# Patient Record
Sex: Male | Born: 1965
Health system: Southern US, Community
[De-identification: ages and names within clinical notes are randomized; demographics above are authoritative.]

## PROBLEM LIST (undated history)

## (undated) DIAGNOSIS — I10 Essential (primary) hypertension: Secondary | ICD-10-CM

## (undated) DIAGNOSIS — I219 Acute myocardial infarction, unspecified: Secondary | ICD-10-CM

## (undated) DIAGNOSIS — F319 Bipolar disorder, unspecified: Secondary | ICD-10-CM

## (undated) DIAGNOSIS — R079 Chest pain, unspecified: Secondary | ICD-10-CM

## (undated) HISTORY — DX: Chest pain, unspecified: R07.9

---

## 1997-09-19 ENCOUNTER — Encounter: Admission: RE | Admit: 1997-09-19 | Discharge: 1997-09-19 | Payer: Self-pay | Admitting: Family Medicine

## 1998-01-17 ENCOUNTER — Emergency Department (HOSPITAL_COMMUNITY): Admission: EM | Admit: 1998-01-17 | Discharge: 1998-01-17 | Payer: Self-pay | Admitting: Emergency Medicine

## 1998-01-20 ENCOUNTER — Encounter: Admission: RE | Admit: 1998-01-20 | Discharge: 1998-01-20 | Payer: Self-pay | Admitting: Family Medicine

## 1999-08-22 ENCOUNTER — Emergency Department (HOSPITAL_COMMUNITY): Admission: EM | Admit: 1999-08-22 | Discharge: 1999-08-22 | Payer: Self-pay | Admitting: *Deleted

## 2000-09-05 ENCOUNTER — Emergency Department (HOSPITAL_COMMUNITY): Admission: EM | Admit: 2000-09-05 | Discharge: 2000-09-05 | Payer: Self-pay

## 2000-09-10 ENCOUNTER — Encounter: Payer: Self-pay | Admitting: Orthopedic Surgery

## 2000-09-10 ENCOUNTER — Ambulatory Visit (HOSPITAL_COMMUNITY): Admission: RE | Admit: 2000-09-10 | Discharge: 2000-09-10 | Payer: Self-pay | Admitting: Orthopedic Surgery

## 2001-02-11 ENCOUNTER — Emergency Department (HOSPITAL_COMMUNITY): Admission: EM | Admit: 2001-02-11 | Discharge: 2001-02-11 | Payer: Self-pay

## 2001-03-30 ENCOUNTER — Encounter: Admission: RE | Admit: 2001-03-30 | Discharge: 2001-03-30 | Payer: Self-pay | Admitting: Family Medicine

## 2001-04-01 ENCOUNTER — Emergency Department (HOSPITAL_COMMUNITY): Admission: EM | Admit: 2001-04-01 | Discharge: 2001-04-01 | Payer: Self-pay

## 2001-04-06 ENCOUNTER — Ambulatory Visit (HOSPITAL_COMMUNITY): Admission: RE | Admit: 2001-04-06 | Discharge: 2001-04-06 | Payer: Self-pay | Admitting: Gastroenterology

## 2001-05-19 ENCOUNTER — Emergency Department (HOSPITAL_COMMUNITY): Admission: EM | Admit: 2001-05-19 | Discharge: 2001-05-19 | Payer: Self-pay | Admitting: Emergency Medicine

## 2001-05-19 ENCOUNTER — Encounter: Payer: Self-pay | Admitting: Emergency Medicine

## 2001-07-23 ENCOUNTER — Emergency Department (HOSPITAL_COMMUNITY): Admission: EM | Admit: 2001-07-23 | Discharge: 2001-07-23 | Payer: Self-pay | Admitting: Emergency Medicine

## 2002-06-09 ENCOUNTER — Encounter: Admission: RE | Admit: 2002-06-09 | Discharge: 2002-06-09 | Payer: Self-pay | Admitting: Sports Medicine

## 2002-08-30 ENCOUNTER — Emergency Department (HOSPITAL_COMMUNITY): Admission: EM | Admit: 2002-08-30 | Discharge: 2002-08-30 | Payer: Self-pay | Admitting: *Deleted

## 2002-08-30 ENCOUNTER — Encounter: Payer: Self-pay | Admitting: *Deleted

## 2002-12-31 ENCOUNTER — Emergency Department (HOSPITAL_COMMUNITY): Admission: EM | Admit: 2002-12-31 | Discharge: 2002-12-31 | Payer: Self-pay

## 2002-12-31 ENCOUNTER — Encounter: Payer: Self-pay | Admitting: *Deleted

## 2003-12-29 ENCOUNTER — Emergency Department (HOSPITAL_COMMUNITY): Admission: EM | Admit: 2003-12-29 | Discharge: 2003-12-29 | Payer: Self-pay | Admitting: Family Medicine

## 2004-05-21 ENCOUNTER — Other Ambulatory Visit: Payer: Self-pay

## 2004-05-21 ENCOUNTER — Emergency Department: Payer: Self-pay | Admitting: Emergency Medicine

## 2004-07-25 ENCOUNTER — Ambulatory Visit: Payer: Self-pay | Admitting: Family Medicine

## 2004-07-26 ENCOUNTER — Encounter: Admission: RE | Admit: 2004-07-26 | Discharge: 2004-07-26 | Payer: Self-pay | Admitting: Sports Medicine

## 2004-09-07 ENCOUNTER — Ambulatory Visit: Payer: Self-pay | Admitting: Sports Medicine

## 2005-01-29 ENCOUNTER — Emergency Department (HOSPITAL_COMMUNITY): Admission: EM | Admit: 2005-01-29 | Discharge: 2005-01-29 | Payer: Self-pay | Admitting: Emergency Medicine

## 2005-07-18 ENCOUNTER — Ambulatory Visit: Payer: Self-pay | Admitting: Internal Medicine

## 2005-07-23 ENCOUNTER — Ambulatory Visit: Payer: Self-pay | Admitting: Internal Medicine

## 2005-11-20 ENCOUNTER — Emergency Department (HOSPITAL_COMMUNITY): Admission: EM | Admit: 2005-11-20 | Discharge: 2005-11-20 | Payer: Self-pay | Admitting: *Deleted

## 2006-08-07 DIAGNOSIS — K920 Hematemesis: Secondary | ICD-10-CM | POA: Insufficient documentation

## 2006-08-07 DIAGNOSIS — R112 Nausea with vomiting, unspecified: Secondary | ICD-10-CM | POA: Insufficient documentation

## 2006-11-19 ENCOUNTER — Emergency Department (HOSPITAL_COMMUNITY): Admission: EM | Admit: 2006-11-19 | Discharge: 2006-11-19 | Payer: Self-pay | Admitting: Emergency Medicine

## 2007-02-06 ENCOUNTER — Encounter: Payer: Self-pay | Admitting: Internal Medicine

## 2009-01-01 ENCOUNTER — Emergency Department (HOSPITAL_COMMUNITY): Admission: EM | Admit: 2009-01-01 | Discharge: 2009-01-01 | Payer: Self-pay | Admitting: Emergency Medicine

## 2009-07-29 ENCOUNTER — Emergency Department (HOSPITAL_COMMUNITY): Admission: EM | Admit: 2009-07-29 | Discharge: 2009-07-29 | Payer: Self-pay | Admitting: Emergency Medicine

## 2010-04-23 ENCOUNTER — Emergency Department (HOSPITAL_COMMUNITY): Admission: EM | Admit: 2010-04-23 | Discharge: 2010-04-24 | Payer: Self-pay | Admitting: Emergency Medicine

## 2010-08-21 LAB — DIFFERENTIAL
Basophils Relative: 0 % (ref 0–1)
Eosinophils Absolute: 0 10*3/uL (ref 0.0–0.7)
Eosinophils Relative: 1 % (ref 0–5)
Lymphs Abs: 1.1 10*3/uL (ref 0.7–4.0)
Monocytes Absolute: 0.8 10*3/uL (ref 0.1–1.0)
Monocytes Relative: 11 % (ref 3–12)

## 2010-08-21 LAB — CBC
HCT: 37.6 % — ABNORMAL LOW (ref 39.0–52.0)
Hemoglobin: 13 g/dL (ref 13.0–17.0)
MCH: 30 pg (ref 26.0–34.0)
MCHC: 34.6 g/dL (ref 30.0–36.0)
MCV: 86.6 fL (ref 78.0–100.0)
RBC: 4.34 MIL/uL (ref 4.22–5.81)

## 2010-08-21 LAB — URINALYSIS, ROUTINE W REFLEX MICROSCOPIC
Bilirubin Urine: NEGATIVE
Glucose, UA: NEGATIVE mg/dL
Hgb urine dipstick: NEGATIVE
Protein, ur: NEGATIVE mg/dL
Urobilinogen, UA: 0.2 mg/dL (ref 0.0–1.0)

## 2010-08-21 LAB — POCT I-STAT, CHEM 8
BUN: 11 mg/dL (ref 6–23)
Creatinine, Ser: 0.9 mg/dL (ref 0.4–1.5)
Glucose, Bld: 99 mg/dL (ref 70–99)
Hemoglobin: 13.3 g/dL (ref 13.0–17.0)
Sodium: 140 mEq/L (ref 135–145)
TCO2: 25 mmol/L (ref 0–100)

## 2011-05-07 ENCOUNTER — Emergency Department (HOSPITAL_COMMUNITY): Payer: Managed Care, Other (non HMO)

## 2011-05-07 ENCOUNTER — Emergency Department (HOSPITAL_COMMUNITY)
Admission: EM | Admit: 2011-05-07 | Discharge: 2011-05-07 | Disposition: A | Discharge status: Home / Self Care | Payer: Managed Care, Other (non HMO) | Attending: Emergency Medicine | Admitting: Emergency Medicine

## 2011-05-07 ENCOUNTER — Encounter: Payer: Self-pay | Admitting: *Deleted

## 2011-05-07 DIAGNOSIS — K5289 Other specified noninfective gastroenteritis and colitis: Secondary | ICD-10-CM | POA: Insufficient documentation

## 2011-05-07 DIAGNOSIS — R109 Unspecified abdominal pain: Secondary | ICD-10-CM | POA: Insufficient documentation

## 2011-05-07 DIAGNOSIS — F319 Bipolar disorder, unspecified: Secondary | ICD-10-CM | POA: Insufficient documentation

## 2011-05-07 DIAGNOSIS — R197 Diarrhea, unspecified: Secondary | ICD-10-CM | POA: Insufficient documentation

## 2011-05-07 DIAGNOSIS — K529 Noninfective gastroenteritis and colitis, unspecified: Secondary | ICD-10-CM

## 2011-05-07 DIAGNOSIS — R05 Cough: Secondary | ICD-10-CM | POA: Insufficient documentation

## 2011-05-07 DIAGNOSIS — R059 Cough, unspecified: Secondary | ICD-10-CM | POA: Insufficient documentation

## 2011-05-07 DIAGNOSIS — R112 Nausea with vomiting, unspecified: Secondary | ICD-10-CM | POA: Insufficient documentation

## 2011-05-07 DIAGNOSIS — R509 Fever, unspecified: Secondary | ICD-10-CM | POA: Insufficient documentation

## 2011-05-07 DIAGNOSIS — IMO0001 Reserved for inherently not codable concepts without codable children: Secondary | ICD-10-CM | POA: Insufficient documentation

## 2011-05-07 DIAGNOSIS — Z79899 Other long term (current) drug therapy: Secondary | ICD-10-CM | POA: Insufficient documentation

## 2011-05-07 HISTORY — DX: Bipolar disorder, unspecified: F31.9

## 2011-05-07 LAB — COMPREHENSIVE METABOLIC PANEL
CO2: 26 mEq/L (ref 19–32)
Calcium: 8.8 mg/dL (ref 8.4–10.5)
Creatinine, Ser: 0.7 mg/dL (ref 0.50–1.35)
GFR calc Af Amer: >90 mL/min (ref >90)
GFR calc non Af Amer: >90 mL/min (ref >90)
Glucose, Bld: 84 mg/dL (ref 70–99)
Sodium: 140 mEq/L (ref 135–145)
Total Protein: 6.7 g/dL (ref 6.0–8.3)

## 2011-05-07 LAB — VALPROIC ACID LEVEL: Valproic Acid Lvl: 79.2 ug/mL (ref 50.0–100.0)

## 2011-05-07 LAB — CBC
Hemoglobin: 12.3 g/dL — ABNORMAL LOW (ref 13.0–17.0)
MCH: 29.4 pg (ref 26.0–34.0)
MCHC: 32.9 g/dL (ref 30.0–36.0)
MCV: 89.3 fL (ref 78.0–100.0)
RBC: 4.19 MIL/uL — ABNORMAL LOW (ref 4.22–5.81)

## 2011-05-07 LAB — LIPASE, BLOOD: Lipase: 21 U/L (ref 11–59)

## 2011-05-07 MED ORDER — KETOROLAC TROMETHAMINE 30 MG/ML IJ SOLN
30.0000 mg | Freq: Once | INTRAMUSCULAR | Status: AC
  Administered 2011-05-07: 30 mg via INTRAVENOUS
  Filled 2011-05-07: qty 1

## 2011-05-07 MED ORDER — SODIUM CHLORIDE 0.9 % IV BOLUS (SEPSIS)
1000.0000 mL | Freq: Once | INTRAVENOUS | Status: AC
  Administered 2011-05-07: 1000 mL via INTRAVENOUS

## 2011-05-07 NOTE — ED Provider Notes (Signed)
History     CSN: [id number] Arrival date & time: 05/07/2011  1:28 PM   First MD Initiated Contact with Patient 05/07/11 1354      No chief complaint on file.   (Consider location/radiation/quality/duration/timing/severity/associated sxs/prior treatment) Patient is a 45 y.o. male presenting with vomiting. The history is provided by the patient.  Emesis  This is a new problem. The current episode started more than 2 days ago (3 days ago). The problem occurs 5 to 10 times per day. The problem has not changed since onset.The emesis has an appearance of stomach contents. The maximum temperature recorded prior to his arrival was 101 to 101.9 F. The fever has been present for 1 to 2 days. Associated symptoms include cough, diarrhea, a fever (101 yesterday) and myalgias. Pertinent negatives include no abdominal pain, no arthralgias, no chills and no headaches. Risk factors include ill contacts (multiple people at work with similar symptoms).    Past Medical History  Diagnosis Date  . Bipolar 1 disorder     History reviewed. No pertinent past surgical history.  No family history on file.  History  Substance Use Topics  . Smoking status: Current Everyday Smoker  . Smokeless tobacco: Not on file  . Alcohol Use: No      Review of Systems  Unable to perform ROS Constitutional: Positive for fever (101 yesterday) and fatigue. Negative for chills.  Respiratory: Positive for cough. Negative for shortness of breath.   Cardiovascular: Negative for chest pain, palpitations and leg swelling.  Gastrointestinal: Positive for vomiting and diarrhea. Negative for nausea and abdominal pain.  Musculoskeletal: Positive for myalgias. Negative for arthralgias.  Skin: Negative for color change and rash.  Neurological: Negative for light-headedness and headaches.  All other systems reviewed and are negative.    Allergies  Review of patient's allergies indicates no known allergies.  Home  Medications   Current Outpatient Rx  Name Route Sig Dispense Refill  . DIVALPROEX SODIUM 500 MG PO TB24 Oral Take 1,000 mg by mouth every evening.      . NYQUIL PO Oral Take 1 tablet by mouth daily as needed. For congestion/cough/fever     . DAYQUIL PO Oral Take 1 capsule by mouth daily as needed. For congestion/cough/fever      BP 120/72  Pulse 74  Temp(Src) 98 F (36.7 C) (Oral)  Resp 16  SpO2 95%  Physical Exam  Nursing note and vitals reviewed. Constitutional: He is oriented to person, place, and time. He appears well-developed and well-nourished.  HENT:  Head: Normocephalic and atraumatic.  Mouth/Throat: Mucous membranes are dry (mildly).  Eyes: EOM are normal. Pupils are equal, round, and reactive to light.  Cardiovascular: Normal rate and regular rhythm.   Pulmonary/Chest: Effort normal and breath sounds normal. No respiratory distress.  Abdominal: Soft. Bowel sounds are normal. He exhibits no distension. There is tenderness (mild, diffuse). There is no rebound and no guarding.  Neurological: He is alert and oriented to person, place, and time.  Skin: Skin is warm and dry.  Psychiatric: He has a normal mood and affect.    ED Course  Procedures (including critical care time)  Labs Reviewed  CBC - Abnormal; Notable for the following:    RBC 4.19 (*)    Hemoglobin 12.3 (*)    HCT 37.4 (*)    All other components within normal limits  COMPREHENSIVE METABOLIC PANEL - Abnormal; Notable for the following:    Total Bilirubin 0.2 (*)    All other components  within normal limits  LIPASE, BLOOD  VALPROIC ACID LEVEL   Dg Chest 2 View  05/07/2011  *RADIOLOGY REPORT*  Clinical Data: Cough, fever.  CHEST - 2 VIEW  Comparison: 04/24/2010  Findings: Heart and mediastinal contours are within normal limits. No focal opacities or effusions.  No acute bony abnormality.  IMPRESSION: No active cardiopulmonary disease.  Original Report Authenticated By: Cyndie Chime, M.D.     1.  VOMITING W/NAUSEA   2. Gastroenteritis       MDM  38 M with 3 days of N/V, non productive cough, fever to 101 yesterday and diffuse muscle aches; states he was at work today, was working on a car, stood up and felt like he was going to pass out. According to his coworkers, he was pale, and they made him come to the ED for evaluation; says if they hadn't forced him to come, he wouldn't currently be here. States multiple people at work have had similar symptoms. Has been taking OTC medications with minimal relief. Denies abd pain, chest pain, palpitations, blood in stool, sick contacts at home, exposure to bad food. Exam remarkable for mild dehydration, mild diffuse abd ttp. Most likely dehydrated/orthostatic due to several days of vomiting and diarrhea; will check orthostatics, CXR due to cough and fever, cough and fever, basic labs, provide hydration and reevaluate.  Labs unremarkable. Pt feeling better. Discussed with pt normal labs, treatment of symptoms, importance of hydration, and indications for return; pt and wife express understanding.      Theotis Burrow, MD 05/07/11 1623

## 2011-05-07 NOTE — ED Notes (Signed)
Pt had vomiting and diarrhea since Saturday.  Pt went to work today and has been feeling bad.  Pt went to stand up and almost passed out.  Pt aches all over and looks pale.  No chest pain.

## 2011-05-08 NOTE — ED Provider Notes (Signed)
I saw and evaluated the patient, reviewed the resident's note and I agree with the findings and plan.   Linkoln Alkire A. Patrica Duel, MD 05/08/11 [phone]

## 2011-06-22 ENCOUNTER — Ambulatory Visit (INDEPENDENT_AMBULATORY_CARE_PROVIDER_SITE_OTHER): Payer: BC Managed Care – PPO

## 2011-06-22 DIAGNOSIS — M25519 Pain in unspecified shoulder: Secondary | ICD-10-CM

## 2011-06-22 DIAGNOSIS — N529 Male erectile dysfunction, unspecified: Secondary | ICD-10-CM

## 2011-06-22 DIAGNOSIS — G4700 Insomnia, unspecified: Secondary | ICD-10-CM

## 2011-06-22 DIAGNOSIS — F3189 Other bipolar disorder: Secondary | ICD-10-CM

## 2011-07-17 ENCOUNTER — Ambulatory Visit (INDEPENDENT_AMBULATORY_CARE_PROVIDER_SITE_OTHER): Payer: BC Managed Care – PPO | Admitting: Family Medicine

## 2011-07-17 VITALS — BP 114/80 | HR 68 | Temp 98.0°F | Resp 18 | Ht 67.5 in | Wt 232.0 lb

## 2011-07-17 DIAGNOSIS — R197 Diarrhea, unspecified: Secondary | ICD-10-CM

## 2011-07-17 DIAGNOSIS — R112 Nausea with vomiting, unspecified: Secondary | ICD-10-CM

## 2011-07-17 LAB — COMPREHENSIVE METABOLIC PANEL
ALT: 23 U/L (ref 0–53)
AST: 19 U/L (ref 0–37)
Albumin: 4.4 g/dL (ref 3.5–5.2)
Alkaline Phosphatase: 83 U/L (ref 39–117)
BUN: 13 mg/dL (ref 6–23)
CO2: 27 mEq/L (ref 19–32)
Calcium: 9.6 mg/dL (ref 8.4–10.5)
Chloride: 106 mEq/L (ref 96–112)
Creat: 0.84 mg/dL (ref 0.50–1.35)
Glucose, Bld: 92 mg/dL (ref 70–99)
Potassium: 4.7 mEq/L (ref 3.5–5.3)
Sodium: 142 mEq/L (ref 135–145)
Total Bilirubin: 0.4 mg/dL (ref 0.3–1.2)
Total Protein: 6.5 g/dL (ref 6.0–8.3)

## 2011-07-17 LAB — POCT CBC
Granulocyte percent: 67.2 %G (ref 37–80)
HCT, POC: 42.4 % — AB (ref 43.5–53.7)
Hemoglobin: 13.7 g/dL — AB (ref 14.1–18.1)
Lymph, poc: 1.6 (ref 0.6–3.4)
MCH, POC: 29.3 pg (ref 27–31.2)
MCHC: 32.3 g/dL (ref 31.8–35.4)
MCV: 90.6 fL (ref 80–97)
MID (cbc): 0.4 (ref 0–0.9)
MPV: 9 fL (ref 0–99.8)
POC Granulocyte: 4 (ref 2–6.9)
POC LYMPH PERCENT: 26.6 %L (ref 10–50)
POC MID %: 6.2 %M (ref 0–12)
Platelet Count, POC: 187 10*3/uL (ref 142–424)
RBC: 4.68 M/uL — AB (ref 4.69–6.13)
RDW, POC: 13.6 %
WBC: 5.9 10*3/uL (ref 4.6–10.2)

## 2011-07-17 MED ORDER — CIPROFLOXACIN HCL 500 MG PO TABS: 500.0000 mg | ORAL_TABLET | Freq: Two times a day (BID) | ORAL | Status: AC

## 2011-07-17 MED ORDER — ONDANSETRON 8 MG PO TBDP: 8.0000 mg | ORAL_TABLET | Freq: Three times a day (TID) | ORAL | Status: AC | PRN

## 2011-07-17 NOTE — Patient Instructions (Signed)
Viral Gastroenteritis Viral gastroenteritis is also known as stomach flu. This condition affects the stomach and intestinal tract. The illness typically lasts 3 to 8 days. Most people develop an immune response. This eventually gets rid of the virus. While this natural response develops, the virus can make you quite ill.  CAUSES  Diarrhea and vomiting are often caused by a virus. Medicines (antibiotics) that kill germs will not help unless there is also a germ (bacterial) infection. SYMPTOMS  The most common symptom is diarrhea. This can cause severe loss of fluids (dehydration) and body salt (electrolyte) imbalance. TREATMENT  Treatments for this illness are aimed at rehydration. Antidiarrheal medicines are not recommended. They do not decrease diarrhea volume and may be harmful. Usually, home treatment is all that is needed. The most serious cases involve vomiting so severely that you are not able to keep down fluids taken by mouth (orally). In these cases, intravenous (IV) fluids are needed. Vomiting with viral gastroenteritis is common, but it will usually go away with treatment. HOME CARE INSTRUCTIONS  Small amounts of fluids should be taken frequently. Large amounts at one time may not be tolerated. Plain water may be harmful in infants and the elderly. Oral rehydration solutions (ORS) are available at pharmacies and grocery stores. ORS replace water and important electrolytes in proper proportions. Sports drinks are not as effective as ORS and may be harmful due to sugars worsening diarrhea.  As a general guideline for children, replace any new fluid losses from diarrhea or vomiting with ORS as follows:   If your child weighs 22 pounds or under (10 kg or less), give 60-120 mL (1/4 - 1/2 cup or 2 - 4 ounces) of ORS for each diarrheal stool or vomiting episode.   If your child weighs more than 22 pounds (more than 10 kgs), give 120-240 mL (1/2 - 1 cup or 4 - 8 ounces) of ORS for each diarrheal  stool or vomiting episode.   In a child with vomiting, it may be helpful to give the above ORS replacement in 5 mL (1 teaspoon) amounts every 5 minutes, then increase as tolerated.   While correcting for dehydration, children should eat normally. However, foods high in sugar should be avoided because this may worsen diarrhea. Large amounts of carbonated soft drinks, juice, gelatin desserts, and other highly sugared drinks should be avoided.   After correction of dehydration, other liquids that are appealing to the child may be added. Children should drink small amounts of fluids frequently and fluids should be increased as tolerated.   Adults should eat normally while drinking more fluids than usual. Drink small amounts of fluids frequently and increase as tolerated. Drink enough water and fluids to keep your urine clear or pale yellow. Broths, weak decaffeinated tea, lemon-lime soft drinks (allowed to go flat), and ORS replace fluids and electrolytes.   Avoid:   Carbonated drinks.   Juice.   Extremely hot or cold fluids.   Caffeine drinks.   Fatty, greasy foods.   Alcohol.   Tobacco.   Too much intake of anything at one time.   Gelatin desserts.   Probiotics are active cultures of beneficial bacteria. They may lessen the amount and number of diarrheal stools in adults. Probiotics can be found in yogurt with active cultures and in supplements.   Wash your hands well to avoid spreading bacteria and viruses.   Antidiarrheal medicines are not recommended for infants and children.   Only take over-the-counter or prescription medicines for   pain, discomfort, or fever as directed by your caregiver. Do not give aspirin to children.   For adults with dehydration, ask your caregiver if you should continue all prescribed and over-the-counter medicines.   If your caregiver has given you a follow-up appointment, it is very important to keep that appointment. Not keeping the appointment  could result in a lasting (chronic) or permanent injury and disability. If there is any problem keeping the appointment, you must call to reschedule.  SEEK IMMEDIATE MEDICAL CARE IF:   You are unable to keep fluids down.   There is no urine output in 6 to 8 hours or there is only a small amount of very dark urine.   You develop shortness of breath.   There is blood in the vomit (may look like coffee grounds) or stool.   Belly (abdominal) pain develops, increases, or localizes.   There is persistent vomiting or diarrhea.   You have a fever.   Your baby is older than 3 months with a rectal temperature of 102 F (38.9 C) or higher.   Your baby is 3 months old or younger with a rectal temperature of 100.4 F (38 C) or higher.  MAKE SURE YOU:   Understand these instructions.   Will watch your condition.   Will get help right away if you are not doing well or get worse.  Document Released: 05/27/2005 Document Revised: 02/06/2011 Document Reviewed: 10/08/2006 Exit[employer] Patient Information 2012 [employer]. 

## 2011-07-17 NOTE — Progress Notes (Signed)
This is a 46 year old man who works in a [employer]. He's had acute nausea vomiting and diarrhea since Saturday night. He says he has had some bright red blood both in the vomitus as well as in the stool. He's also had cramps. He try to work on Monday for a while but felt too sick to continue.  He does have any ongoing bowel problems. He had an episode of this nausea and vomiting one year ago but has basically done well since. He notes that his shoulder is still sore however  Objective: Middle-aged man who appears fatigued but in no acute distress. He is alert and cooperative.  HEENT is normal  Chest is clear: Heart is regular without murmur or tachycardia.  Abdomen: Active bowel sounds soft nontender without mass, or hepatosplenomegaly. skin is dry, there is no scleral icterus   Results for orders placed in visit on 07/17/11  POCT CBC      Component Value Range   WBC 5.9  4.6 - 10.2 (K/uL)   Lymph, poc 1.6  0.6 - 3.4    POC LYMPH PERCENT 26.6  10 - 50 (%L)   MID (cbc) 0.4  0 - 0.9    POC MID % 6.2  0 - 12 (%M)   POC Granulocyte 4.0  2 - 6.9    Granulocyte percent 67.2  37 - 80 (%G)   RBC 4.68 (*) 4.69 - 6.13 (M/uL)   Hemoglobin 13.7 (*) 14.1 - 18.1 (g/dL)   HCT, POC 16.1 (*) 09.6 - 53.7 (%)   MCV 90.6  80 - 97 (fL)   MCH, POC 29.3  27 - 31.2 (pg)   MCHC 32.3  31.8 - 35.4 (g/dL)   RDW, POC 04.5     Platelet Count, POC 187  142 - 424 (K/uL)   MPV 9.0  0 - 99.8 (fL)    Assessment: Acute gastroenteritis the

## 2011-07-26 ENCOUNTER — Telehealth: Payer: Self-pay | Admitting: Family Medicine

## 2011-07-26 NOTE — Telephone Encounter (Signed)
I called Gabriel Potter's residence and left a messsage that the prescription can be purchased for $10 and we can provide the injection teaching or he can get it here

## 2011-10-30 NOTE — Progress Notes (Signed)
Completed and faxed back prior auth for pt's Viagra to [facility] [place]

## 2011-11-12 NOTE — Progress Notes (Signed)
Received notice from [employer] that Viagra has been approved through 10/31/12. Case # [id number] up to * tablets / month. Faxed approval notice to pharmacy

## 2011-11-25 ENCOUNTER — Ambulatory Visit: Payer: BC Managed Care – PPO

## 2011-11-30 ENCOUNTER — Ambulatory Visit (INDEPENDENT_AMBULATORY_CARE_PROVIDER_SITE_OTHER): Payer: BC Managed Care – PPO | Admitting: Family Medicine

## 2011-11-30 VITALS — BP 136/95 | HR 60 | Temp 98.0°F | Resp 16 | Ht 68.25 in | Wt 233.2 lb

## 2011-11-30 DIAGNOSIS — E291 Testicular hypofunction: Secondary | ICD-10-CM

## 2011-11-30 DIAGNOSIS — M549 Dorsalgia, unspecified: Secondary | ICD-10-CM

## 2011-11-30 MED ORDER — TESTOSTERONE CYPIONATE 200 MG/ML IM SOLN
200.0000 mg | INTRAMUSCULAR | Status: AC
  Administered 2011-11-30: 200 mg via INTRAMUSCULAR

## 2011-11-30 MED ORDER — CYCLOBENZAPRINE HCL 10 MG PO TABS: 10.0000 mg | ORAL_TABLET | Freq: Three times a day (TID) | ORAL | Status: AC | PRN

## 2011-11-30 MED ORDER — HYDROCODONE-ACETAMINOPHEN 7.5-750 MG PO TABS: 1.0000 | ORAL_TABLET | Freq: Three times a day (TID) | ORAL | Status: AC | PRN

## 2011-11-30 NOTE — Progress Notes (Signed)
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 He picked up his testosterone injectable and needs to get that started today. He starting a new job on Monday at [employer].  He's had a chronic problem with low back pain. He's taken his wife's Vicodin at times, and has done well with Flexeril and the pain medicine when the back pain gets acute. Recently his become more of a problem, with stiffness and soreness in the low lumbar region. She's had no fever, urinary symptoms, bladder symptoms, radiating pain down the leg, or trauma.  Objective: HEENT unremarkable Back: Mildly tender to palpation in the lumbar region Straight-leg raising negative Reflexes: Symmetrical.  Patient is in no acute distress and his body habitus suggest that he needs to lose some weight.  Assessment: Hypergonadism and low back pain, chronic  Plan: Vicodin and Flexeril for the back pain, testosterone for the abdomen is

## 2012-07-15 ENCOUNTER — Other Ambulatory Visit: Payer: Self-pay | Admitting: Family Medicine

## 2012-07-15 NOTE — Telephone Encounter (Signed)
Please pull paper chart. No Valproic acid level in epic.

## 2012-07-17 NOTE — Telephone Encounter (Signed)
Chart pulled to PA pool [id number]

## 2012-08-09 ENCOUNTER — Other Ambulatory Visit: Payer: Self-pay | Admitting: Physician Assistant

## 2012-08-11 ENCOUNTER — Other Ambulatory Visit: Payer: Self-pay | Admitting: Family Medicine

## 2012-08-23 ENCOUNTER — Telehealth: Payer: Self-pay

## 2012-08-23 NOTE — Telephone Encounter (Signed)
Dr Elbert Ewings Wants to speak to dr L about his depakote refills

## 2012-08-24 NOTE — Telephone Encounter (Signed)
Pt is needing to speak with Dr Milus Glazier

## 2012-08-25 ENCOUNTER — Ambulatory Visit (INDEPENDENT_AMBULATORY_CARE_PROVIDER_SITE_OTHER): Payer: BC Managed Care – PPO | Admitting: Family Medicine

## 2012-08-25 VITALS — BP 142/90 | HR 80 | Temp 98.5°F | Resp 18 | Ht 67.25 in | Wt 229.8 lb

## 2012-08-25 DIAGNOSIS — F319 Bipolar disorder, unspecified: Secondary | ICD-10-CM

## 2012-08-25 DIAGNOSIS — N529 Male erectile dysfunction, unspecified: Secondary | ICD-10-CM

## 2012-08-25 DIAGNOSIS — E291 Testicular hypofunction: Secondary | ICD-10-CM

## 2012-08-25 LAB — POCT CBC
Granulocyte percent: 63.8 %G (ref 37–80)
HCT, POC: 44.1 % (ref 43.5–53.7)
Hemoglobin: 14 g/dL — AB (ref 14.1–18.1)
Lymph, poc: 2.3 (ref 0.6–3.4)
MCH, POC: 29.4 pg (ref 27–31.2)
MCHC: 31.7 g/dL — AB (ref 31.8–35.4)
MCV: 92.7 fL (ref 80–97)
MID (cbc): 0.5 (ref 0–0.9)
MPV: 8.8 fL (ref 0–99.8)
POC Granulocyte: 4.9 (ref 2–6.9)
POC LYMPH PERCENT: 30.2 %L (ref 10–50)
POC MID %: 6 %M (ref 0–12)
Platelet Count, POC: 209 10*3/uL (ref 142–424)
RBC: 4.76 M/uL (ref 4.69–6.13)
RDW, POC: 13.8 %
WBC: 7.7 10*3/uL (ref 4.6–10.2)

## 2012-08-25 MED ORDER — DIVALPROEX SODIUM ER 500 MG PO TB24: ORAL_TABLET | ORAL | Status: DC

## 2012-08-25 MED ORDER — TESTOSTERONE 30 MG/ACT TD SOLN: 2.0000 "application " | Freq: Every morning | TRANSDERMAL | Status: DC

## 2012-08-25 MED ORDER — SILDENAFIL CITRATE 100 MG PO TABS: 50.0000 mg | ORAL_TABLET | Freq: Every day | ORAL | Status: DC | PRN

## 2012-08-25 NOTE — Progress Notes (Signed)
Working at [employer] in [employer] car.  Ran out of Depakote 4 days ago.    Wants to switch to topical androgens.

## 2012-08-26 LAB — COMPREHENSIVE METABOLIC PANEL
ALT: 24 U/L (ref 0–53)
AST: 20 U/L (ref 0–37)
Albumin: 4.8 g/dL (ref 3.5–5.2)
Alkaline Phosphatase: 83 U/L (ref 39–117)
BUN: 13 mg/dL (ref 6–23)
CO2: 25 mEq/L (ref 19–32)
Calcium: 9.5 mg/dL (ref 8.4–10.5)
Chloride: 104 mEq/L (ref 96–112)
Creat: 0.81 mg/dL (ref 0.50–1.35)
Glucose, Bld: 81 mg/dL (ref 70–99)
Potassium: 3.7 mEq/L (ref 3.5–5.3)
Sodium: 140 mEq/L (ref 135–145)
Total Bilirubin: 0.3 mg/dL (ref 0.3–1.2)
Total Protein: 7.2 g/dL (ref 6.0–8.3)

## 2012-08-26 LAB — PSA: PSA: 0.55 ng/mL (ref <=4.00)

## 2012-08-26 LAB — TSH: TSH: 5.277 u[IU]/mL — ABNORMAL HIGH (ref 0.350–4.500)

## 2012-08-27 ENCOUNTER — Telehealth: Payer: Self-pay | Admitting: *Deleted

## 2012-08-27 NOTE — Telephone Encounter (Signed)
Spoke to pt in regards to lab results. Pt was unable to fill testosterone due to coupon expired ($500 co pay) does he have another option? Please advise. Pt is awaiting return call.

## 2012-08-27 NOTE — Telephone Encounter (Signed)
Message copied by Malissa Hippo on Thu Aug 27, 2012  3:48 PM ------      Message from: Elvina Sidle      Created: Wed Aug 26, 2012  5:08 PM       Patient has abnormal lab values.  The thyroid is underfunctioning slightly.  I would like to repeat this after his medications have been refilled for 6 weeks.  It may be that he needs to be taking thyroid medication to improve his energy level. ------

## 2012-08-28 ENCOUNTER — Other Ambulatory Visit: Payer: Self-pay | Admitting: Family Medicine

## 2012-08-28 DIAGNOSIS — E039 Hypothyroidism, unspecified: Secondary | ICD-10-CM

## 2012-08-28 MED ORDER — LEVOTHYROXINE SODIUM 50 MCG PO TABS: 50.0000 ug | ORAL_TABLET | Freq: Every day | ORAL | Status: DC

## 2012-09-03 NOTE — Progress Notes (Signed)
Prior auth approved for Viagra through 08/25/12. Pharm notified.

## 2012-09-04 ENCOUNTER — Telehealth: Payer: Self-pay

## 2012-09-04 MED ORDER — TESTOSTERONE 40.5 MG/2.5GM (1.62%) TD GEL: 40.5000 g | Freq: Every day | TRANSDERMAL | Status: DC

## 2012-09-04 NOTE — Telephone Encounter (Signed)
androgel prescribed/  

## 2012-09-04 NOTE — Telephone Encounter (Signed)
faxed

## 2012-09-04 NOTE — Telephone Encounter (Signed)
Androgel prescribed.

## 2012-09-04 NOTE — Telephone Encounter (Signed)
Prior auth for Axiron was denied because pt has not tried/failed Androgel or Androderm. Dr L, Do you want to change the Rx to one of these?

## 2013-03-08 ENCOUNTER — Other Ambulatory Visit: Payer: Self-pay | Admitting: Family Medicine

## 2013-04-07 ENCOUNTER — Other Ambulatory Visit: Payer: Self-pay | Admitting: Physician Assistant

## 2013-04-28 ENCOUNTER — Telehealth: Payer: Self-pay | Admitting: Family Medicine

## 2013-04-28 MED ORDER — DIVALPROEX SODIUM ER 500 MG PO TB24: ORAL_TABLET | ORAL | Status: DC

## 2013-04-28 NOTE — Telephone Encounter (Signed)
Sent #30 to pharmacy.  Needs OV.  No further refills.

## 2013-04-28 NOTE — Telephone Encounter (Signed)
Pt stated his prescription of Depakote 1000mg  qd was written for #30 when he normally gets #60. He is out and is feeling the effects. He is requesting refills to hold him over until his next appt but I dont see that he has an appointment scheduled. I informed pt that he has not been seen since March and needed to have a follow up visit. He stated he would try to come in Saturday but didn't know if he could get off work and then hung up on me.

## 2013-04-29 NOTE — Telephone Encounter (Signed)
Called him to advise. He wants to know Dr Milus Glazier schedule beginning of Dec, I told him I will call him when Dec schedule is out.

## 2013-05-03 NOTE — Telephone Encounter (Signed)
Left message for him to call back and we can provide him with Dr Milus Glazier schedule

## 2013-07-30 ENCOUNTER — Emergency Department: Payer: Self-pay | Admitting: Emergency Medicine

## 2013-07-30 LAB — CBC WITH DIFFERENTIAL/PLATELET
Basophil #: 0.1 10*3/uL (ref 0.0–0.1)
Basophil %: 0.9 %
EOS PCT: 1.2 %
Eosinophil #: 0.1 10*3/uL (ref 0.0–0.7)
HCT: 46.6 % (ref 40.0–52.0)
HGB: 15.9 g/dL (ref 13.0–18.0)
Lymphocyte #: 2.4 10*3/uL (ref 1.0–3.6)
Lymphocyte %: 25 %
MCH: 30.2 pg (ref 26.0–34.0)
MCHC: 34.2 g/dL (ref 32.0–36.0)
MCV: 88 fL (ref 80–100)
Monocyte #: 0.5 x10 3/mm (ref 0.2–1.0)
Monocyte %: 5.8 %
NEUTROS ABS: 6.3 10*3/uL (ref 1.4–6.5)
NEUTROS PCT: 67.1 %
Platelet: 207 10*3/uL (ref 150–440)
RBC: 5.27 10*6/uL (ref 4.40–5.90)
RDW: 13.4 % (ref 11.5–14.5)
WBC: 9.4 10*3/uL (ref 3.8–10.6)

## 2013-07-30 LAB — COMPREHENSIVE METABOLIC PANEL
ALBUMIN: 3.8 g/dL (ref 3.4–5.0)
ANION GAP: 6 — AB (ref 7–16)
Alkaline Phosphatase: 146 U/L — ABNORMAL HIGH
BILIRUBIN TOTAL: 0.3 mg/dL (ref 0.2–1.0)
BUN: 15 mg/dL (ref 7–18)
CALCIUM: 9 mg/dL (ref 8.5–10.1)
Chloride: 109 mmol/L — ABNORMAL HIGH (ref 98–107)
Co2: 25 mmol/L (ref 21–32)
Creatinine: 0.92 mg/dL (ref 0.60–1.30)
EGFR (African American): >60
EGFR (Non-African Amer.): >60
Glucose: 88 mg/dL (ref 65–99)
Osmolality: 280 (ref 275–301)
Potassium: 3.9 mmol/L (ref 3.5–5.1)
SGOT(AST): 29 U/L (ref 15–37)
SGPT (ALT): 46 U/L (ref 12–78)
Sodium: 140 mmol/L (ref 136–145)
Total Protein: 7.7 g/dL (ref 6.4–8.2)

## 2013-12-31 ENCOUNTER — Encounter (HOSPITAL_COMMUNITY): Payer: Self-pay | Admitting: Emergency Medicine

## 2013-12-31 ENCOUNTER — Emergency Department (HOSPITAL_COMMUNITY)
Admission: EM | Admit: 2013-12-31 | Discharge: 2013-12-31 | Disposition: A | Discharge status: Home / Self Care | Payer: BC Managed Care – PPO | Attending: Emergency Medicine | Admitting: Emergency Medicine

## 2013-12-31 DIAGNOSIS — Z8659 Personal history of other mental and behavioral disorders: Secondary | ICD-10-CM | POA: Insufficient documentation

## 2013-12-31 DIAGNOSIS — F17200 Nicotine dependence, unspecified, uncomplicated: Secondary | ICD-10-CM | POA: Insufficient documentation

## 2013-12-31 DIAGNOSIS — S61512A Laceration without foreign body of left wrist, initial encounter: Secondary | ICD-10-CM

## 2013-12-31 DIAGNOSIS — S61509A Unspecified open wound of unspecified wrist, initial encounter: Secondary | ICD-10-CM | POA: Insufficient documentation

## 2013-12-31 DIAGNOSIS — Y929 Unspecified place or not applicable: Secondary | ICD-10-CM | POA: Insufficient documentation

## 2013-12-31 DIAGNOSIS — Y9389 Activity, other specified: Secondary | ICD-10-CM | POA: Insufficient documentation

## 2013-12-31 DIAGNOSIS — Z79899 Other long term (current) drug therapy: Secondary | ICD-10-CM | POA: Insufficient documentation

## 2013-12-31 DIAGNOSIS — Z23 Encounter for immunization: Secondary | ICD-10-CM | POA: Insufficient documentation

## 2013-12-31 DIAGNOSIS — Y990 Civilian activity done for income or pay: Secondary | ICD-10-CM | POA: Insufficient documentation

## 2013-12-31 DIAGNOSIS — W268XXA Contact with other sharp object(s), not elsewhere classified, initial encounter: Secondary | ICD-10-CM | POA: Insufficient documentation

## 2013-12-31 MED ORDER — TETANUS-DIPHTH-ACELL PERTUSSIS 5-2.5-18.5 LF-MCG/0.5 IM SUSP
0.5000 mL | Freq: Once | INTRAMUSCULAR | Status: AC
  Administered 2013-12-31: 0.5 mL via INTRAMUSCULAR
  Filled 2013-12-31: qty 0.5

## 2013-12-31 NOTE — ED Provider Notes (Signed)
CSN: [id number]     Arrival date & time 12/31/13  1810 History  This chart was scribed for non-physician provider Emilia BeckKaitlyn Vallory Oetken, PA-C, working with Rolland PorterMark James, MD by Phillis HaggisGabriella Gaje, ED Scribe. This patient was seen in room WTR7/WTR7 and patient care was started at 6:52 PM.    Chief Complaint  Patient presents with  . Laceration   Patient is a 48 y.o. male presenting with skin laceration. The history is provided by the patient. No language interpreter was used.  Laceration Location:  Shoulder/arm Shoulder/arm laceration location:  L wrist  HPI Comments: Gabriel Potter is a 48 y.o. male who presents to the Emergency Department complaining of left wrist laceration onset PTA. He reports that he was checking the power on a car when the fan in the car started and cut his left wrist. He reports that he kept the arm wrapped and elevated and the bleeding has been controlled. He denies any other injuries. He denies knowing when his last tdap was.    Past Medical History  Diagnosis Date  . Bipolar 1 disorder    History reviewed. No pertinent past surgical history. No family history on file. History  Substance Use Topics  . Smoking status: Current Every Day Smoker    Types: Cigarettes  . Smokeless tobacco: Not on file  . Alcohol Use: No    Review of Systems  Skin: Positive for wound.  All other systems reviewed and are negative.  Allergies  Review of patient's allergies indicates no known allergies.  Home Medications   Prior to Admission medications   Medication Sig Start Date End Date Taking? Authorizing Provider  HYDROcodone-ibuprofen (VICOPROFEN) 7.5-200 MG per tablet Take 1 tablet by mouth every 8 (eight) hours as needed for moderate pain (pain).   Yes Historical Provider, MD  ibuprofen (ADVIL,MOTRIN) 200 MG tablet Take 800 mg by mouth every 6 (six) hours as needed (pain).   Yes Historical Provider, MD  omeprazole (PRILOSEC) 20 MG capsule Take 20 mg by mouth daily.   Yes  Historical Provider, MD   BP 141/79  Pulse 82  Temp(Src) 98.8 F (37.1 C) (Oral)  Resp 18  SpO2 99% Physical Exam  Nursing note and vitals reviewed. Constitutional: He is oriented to person, place, and time. He appears well-developed and well-nourished.  HENT:  Head: Normocephalic and atraumatic.  Eyes: EOM are normal.  Neck: Normal range of motion. Neck supple.  Cardiovascular: Normal rate and intact distal pulses.   Radial pulse intact bilaterally.   Pulmonary/Chest: Effort normal.  Musculoskeletal: Normal range of motion.  Neurological: He is alert and oriented to person, place, and time.  Bilateral hand strength and sensation equal and intact bilaterally.   Skin: Skin is warm and dry.  4cm laceration of left wrist with well approximated edges. Bleeding controlled at this time.   Psychiatric: He has a normal mood and affect. His behavior is normal.    ED Course  Procedures (including critical care time) DIAGNOSTIC STUDIES: Oxygen Saturation is 99% on room air, normal by my interpretation.    COORDINATION OF CARE: 6:58 PM-Discussed treatment plan which includes laceration repar with pt at bedside and pt agreed to plan.   LACERATION REPAIR PROCEDURE NOTE The patient's identification was confirmed and consent was obtained. This procedure was performed by Emilia BeckKaitlyn Ilay Capshaw, PA-C, at 6:58 PM. Site: left wrist Anesthetic used (type and amt): lidocaine 1% without epi Suture type/size:4-0 prolene Length:4cm # of Sutures: 4 Technique:simple Complexity: simple Antibx ointment applied: no Tetanus  UTD or ordered: ordered Site anesthetized, irrigated with NS, explored without evidence of foreign body, wound well approximated, site covered with dry, sterile dressing.  Patient tolerated procedure well without complications. Instructions for care discussed verbally and patient provided with additional written instructions for homecare and f/u.  Labs Review Labs Reviewed - No  data to display  Imaging Review No results found.   EKG Interpretation None      MDM   Final diagnoses:  Wrist laceration, left, initial encounter    7:14 PM Laceration repaired without difficulty. Vitals stable and patient afebrile. Patient will have tdap here. Patient instructed to return in 10 days for suture removal.    I personally performed the services described in this documentation, which was scribed in my presence. The recorded information has been reviewed and is accurate.     Emilia Beck, PA-C 12/31/13 1918

## 2013-12-31 NOTE — Discharge Instructions (Signed)
Keep wound clean. Return to the ED in 10 days for suture removal. Refer to attached documents for more information.  °

## 2013-12-31 NOTE — ED Notes (Addendum)
Pt reports being at work, working on a car AC when the fan turned on and caused a laceration to his left wrist. Pt has full mobility of his fingers and has a capillary refill of less than two seconds. Pt is A/O x4, in NAD, and vitals are WDL. Pt reports taking 800 mg of ibuprofen prior to arrival.

## 2014-01-07 NOTE — ED Provider Notes (Signed)
Medical screening examination/treatment/procedure(s) were performed by non-physician practitioner and as supervising physician I was immediately available for consultation/collaboration.   EKG Interpretation None        Lenaya Pietsch, MD 01/07/14 0022 

## 2014-04-27 ENCOUNTER — Encounter (HOSPITAL_COMMUNITY): Payer: Self-pay | Admitting: Emergency Medicine

## 2014-04-27 ENCOUNTER — Emergency Department (HOSPITAL_COMMUNITY)
Admission: EM | Admit: 2014-04-27 | Discharge: 2014-04-27 | Disposition: A | Discharge status: Home / Self Care | Payer: BC Managed Care – PPO | Attending: Emergency Medicine | Admitting: Emergency Medicine

## 2014-04-27 DIAGNOSIS — K088 Other specified disorders of teeth and supporting structures: Secondary | ICD-10-CM | POA: Insufficient documentation

## 2014-04-27 DIAGNOSIS — Z720 Tobacco use: Secondary | ICD-10-CM | POA: Diagnosis not present

## 2014-04-27 DIAGNOSIS — K029 Dental caries, unspecified: Secondary | ICD-10-CM | POA: Diagnosis not present

## 2014-04-27 DIAGNOSIS — Z8659 Personal history of other mental and behavioral disorders: Secondary | ICD-10-CM | POA: Diagnosis not present

## 2014-04-27 DIAGNOSIS — K0381 Cracked tooth: Secondary | ICD-10-CM | POA: Diagnosis not present

## 2014-04-27 DIAGNOSIS — Z79899 Other long term (current) drug therapy: Secondary | ICD-10-CM | POA: Insufficient documentation

## 2014-04-27 DIAGNOSIS — K0889 Other specified disorders of teeth and supporting structures: Secondary | ICD-10-CM

## 2014-04-27 MED ORDER — CLINDAMYCIN HCL 300 MG PO CAPS: 300.0000 mg | ORAL_CAPSULE | Freq: Four times a day (QID) | ORAL | Status: DC

## 2014-04-27 MED ORDER — BUPIVACAINE-EPINEPHRINE (PF) 0.5% -1:200000 IJ SOLN
3.6000 mL | Freq: Once | INTRAMUSCULAR | Status: AC
  Administered 2014-04-27: 3.6 mL
  Filled 2014-04-27: qty 3.6

## 2014-04-27 MED ORDER — HYDROCODONE-ACETAMINOPHEN 5-325 MG PO TABS: 1.0000 | ORAL_TABLET | Freq: Four times a day (QID) | ORAL | Status: DC | PRN

## 2014-04-27 MED ORDER — NAPROXEN 500 MG PO TABS: 500.0000 mg | ORAL_TABLET | Freq: Two times a day (BID) | ORAL | Status: DC | PRN

## 2014-04-27 NOTE — ED Provider Notes (Signed)
CSN: [id number]     Arrival date & time 04/27/14  2102 History   First MD Initiated Contact with Patient 04/27/14 2113     This chart was scribed for non-physician practitioner, Allen DerryMercedes Camprubi-Soms PA-C working with Purvis SheffieldForrest Harrison, MD by Arlan OrganAshley Leger, ED Scribe. This patient was seen in room TR09C/TR09C and the patient's care was started at 9:31 PM.   Chief Complaint  Patient presents with  . Dental Pain   Patient is a 48 y.o. male presenting with tooth pain. The history is provided by the patient. No language interpreter was used.  Dental Pain Location:  Upper and lower Upper teeth location:  7/RU lateral incisor Lower teeth location:  32/RL 3rd molar Quality:  Sharp Severity:  Severe Onset quality:  Gradual Duration:  2 days Timing:  Constant Progression:  Worsening Chronicity:  New Context: dental fracture   Relieved by:  Nothing Worsened by:  Pressure Ineffective treatments:  NSAIDs and acetaminophen Associated symptoms: facial pain   Associated symptoms: no congestion, no difficulty swallowing, no drooling, no facial swelling, no fever, no gum swelling, no headaches, no neck pain, no neck swelling, no oral bleeding, no oral lesions and no trismus     HPI Comments: Gabriel Potter is a 48 y.o. male who presents to the Emergency Department complaining of constant, moderate R upper and lower dental pain x 3 hours that has progressively worsened since he fractured the teeth #7 and 32 while eating dinner. Pain radiates into the R side of the face. Currently he rates pain 10/10 and described as sharp. Pain is exacerbated with pressure to the teeth. No alleviating factors at this time. Gabriel Potter has tried Clindamycin from a previous dental problem with no improvement. He has also tried his wife's prescribed Vicodin without any relief. Pt denies any ear pain or drainage. No neck swelling, gum swelling or drainage, congestion, facial swelling, eye drainage, fever, trismus, abd pain,  nausea, vomiting. No facial numbness or tingling. No difficulty swallowing or drooling.  Past Medical History  Diagnosis Date  . Bipolar 1 disorder    History reviewed. No pertinent past surgical history. History reviewed. No pertinent family history. History  Substance Use Topics  . Smoking status: Current Every Day Smoker    Types: Cigarettes  . Smokeless tobacco: Not on file  . Alcohol Use: No    Review of Systems  Constitutional: Negative for fever.  HENT: Positive for dental problem. Negative for congestion, drooling, ear discharge, ear pain, facial swelling, mouth sores, sinus pressure, tinnitus and trouble swallowing.   Eyes: Negative for pain and discharge.  Respiratory: Negative for shortness of breath.   Gastrointestinal: Negative for nausea, vomiting and abdominal pain.  Musculoskeletal: Negative for neck pain and neck stiffness.  Skin: Negative for color change.  Neurological: Negative for weakness, light-headedness, numbness and headaches.   A complete 10 system review of systems was obtained and all systems are negative except as noted in the HPI and PMH.     Allergies  Review of patient's allergies indicates no known allergies.  Home Medications   Prior to Admission medications   Medication Sig Start Date End Date Taking? Authorizing Provider  HYDROcodone-ibuprofen (VICOPROFEN) 7.5-200 MG per tablet Take 1 tablet by mouth every 8 (eight) hours as needed for moderate pain (pain).    Historical Provider, MD  ibuprofen (ADVIL,MOTRIN) 200 MG tablet Take 800 mg by mouth every 6 (six) hours as needed (pain).    Historical Provider, MD  omeprazole (PRILOSEC) 20  MG capsule Take 20 mg by mouth daily.    Historical Provider, MD   Triage Vitals: BP 155/92 mmHg  Pulse 81  Temp(Src) 98 F (36.7 C) (Oral)  Resp 18  SpO2 98%   Physical Exam  Constitutional: He is oriented to person, place, and time. Vital signs are normal. He appears well-developed and well-nourished.   Non-toxic appearance. He appears distressed (in pain).  Afebrile, nontoxic, appears in pain.  HENT:  Head: Normocephalic and atraumatic.  Nose: Nose normal.  Mouth/Throat: Uvula is midline, oropharynx is clear and moist and mucous membranes are normal. No trismus in the jaw. Abnormal dentition. Dental caries present. No dental abscesses or uvula swelling.    Dental fracture of RU incisor #7 and RL molar #32, partial fracture of both teeth, with no exposed root. Diffuse dental decay. No abscess or erythema, no drainage. No trismus or uvular swelling, no drooling. Oropharynx clear and moist.  Eyes: Conjunctivae and EOM are normal. Right eye exhibits no discharge. Left eye exhibits no discharge.  Neck: Normal range of motion. Neck supple.  Cardiovascular: Normal rate.   Pulmonary/Chest: Effort normal. No respiratory distress.  Abdominal: Normal appearance. He exhibits no distension.  Musculoskeletal: Normal range of motion.  Neurological: He is alert and oriented to person, place, and time. He has normal strength. No sensory deficit. Gait normal.  Skin: Skin is warm, dry and intact. No rash noted. No erythema.  Psychiatric: He has a normal mood and affect.  Nursing note and vitals reviewed.   ED Course  Dental Block Date/Time: 04/27/2014 10:07 PM Performed by: Allen Derry STRUPP Authorized by: Ramond Marrow Consent: Verbal consent obtained. Risks and benefits: risks, benefits and alternatives were discussed Consent given by: patient Patient understanding: patient states understanding of the procedure being performed Patient consent: the patient's understanding of the procedure matches consent given Patient identity confirmed: verbally with patient Local anesthesia used: yes Anesthesia: local infiltration Local anesthetic: bupivacaine 0.5% with epinephrine Anesthetic total: 3.6 ml Patient sedated: no Patient tolerance: Patient tolerated the procedure well  with no immediate complications Comments: Dental block of RU incisor #7 and RL molar #32   (including critical care time)  DIAGNOSTIC STUDIES: Oxygen Saturation is 98% on RA, Normal by my interpretation.    COORDINATION OF CARE: 10:07 PM- Will give pt dental block. Discussed treatment plan with pt at bedside and pt agreed to plan.      Labs Review Labs Reviewed - No data to display  Imaging Review No results found.   EKG Interpretation None      MDM   Final diagnoses:  Dental decay  Pain, dental    48y/o male with dental pain associated with dental fracture. Patient afebrile, non toxic appearing and swallowing secretions well. Dental block provided with pain relief. I gave patient referral to dentist and stressed the importance of dental follow up for ultimate management of dental pain.  I have also discussed reasons to return immediately to the ER.  Patient expresses understanding and agrees with plan.  I will also give clindamycin rx since pt had fractured tooth, and pain control.    I personally performed the services described in this documentation, which was scribed in my presence. The recorded information has been reviewed and is accurate.  BP 155/92 mmHg  Pulse 81  Temp(Src) 98 F (36.7 C) (Oral)  Resp 18  SpO2 98%  Meds ordered this encounter  Medications  . bupivacaine-epinephrine (MARCAINE W/ EPI) 0.5% -1:200000 injection 3.6 mL  Sig:   . naproxen (NAPROSYN) 500 MG tablet    Sig: Take 1 tablet (500 mg total) by mouth 2 (two) times daily as needed for mild pain, moderate pain or headache (TAKE WITH MEALS.).    Dispense:  20 tablet    Refill:  0    Order Specific Question:  Supervising Provider    Answer:  Eber HongMILLER, BRIAN D [[id number]]  . HYDROcodone-acetaminophen (NORCO) 5-325 MG per tablet    Sig: Take 1-2 tablets by mouth every 6 (six) hours as needed for severe pain.    Dispense:  6 tablet    Refill:  0    Order Specific Question:  Supervising Provider     Answer:  Eber HongMILLER, BRIAN D [[id number]]  . clindamycin (CLEOCIN) 300 MG capsule    Sig: Take 1 capsule (300 mg total) by mouth 4 (four) times daily. X 7 days    Dispense:  28 capsule    Refill:  0    Order Specific Question:  Supervising Provider    Answer:  Vida RollerMILLER, BRIAN D [address][id number]]       Trey Bebee Strupp Camprubi-Soms, PA-C 04/27/14 2227  Purvis SheffieldForrest Harrison, MD 04/28/14 0006

## 2014-04-27 NOTE — ED Notes (Signed)
Dental pain for couple of days; tooth has broken off. Cannot get pain under control with OTC.

## 2014-04-27 NOTE — Discharge Instructions (Signed)
Apply warm compresses to jaw throughout the day. Take antibiotic until finished. Take naprosyn and norco as directed, as needed for pain but do not drive or operate machinery with pain medication use. Followup with a dentist is very important for ongoing evaluation and management of recurrent dental pain. Call the dentist you've been referred to, or one from the list below, to make an appointment as soon as possible. Return to emergency department for emergent changing or worsening symptoms.  Emergency Department Resource Guide 1) Find a Doctor and Pay Out of Pocket Although you won't have to find out who is covered by your insurance plan, it is a good idea to ask around and get recommendations. You will then need to call the office and see if the doctor you have chosen will accept you as a new patient and what types of options they offer for patients who are self-pay. Some doctors offer discounts or will set up payment plans for their patients who do not have insurance, but you will need to ask so you aren't surprised when you get to your appointment.  2) Contact Your Local Health Department Not all health departments have doctors that can see patients for sick visits, but many do, so it is worth a call to see if yours does. If you don't know where your local health department is, you can check in your phone book. The CDC also has a tool to help you locate your state's health department, and many state websites also have listings of all of their local health departments.  3) Find a Walk-in Clinic If your illness is not likely to be very severe or complicated, you may want to try a walk in clinic. These are popping up all over the country in pharmacies, drugstores, and shopping centers. They're usually staffed by nurse practitioners or physician assistants that have been trained to treat common illnesses and complaints. They're usually fairly quick and inexpensive. However, if you have serious medical  issues or chronic medical problems, these are probably not your best option.  No Primary Care Doctor: - Call Health Connect at  [phone] - they can help you locate a primary care doctor that  accepts your insurance, provides certain services, etc. - Physician Referral Service- [phone]  Chronic Pain Problems: Organization         Address  Phone   Notes  [facility]  [phone] Patients need to be referred by their primary care doctor.   Medication Assistance: Organization         Address  Phone   Notes  [facility] [address] [place], [place] [address] [phone] --Must be a resident of [place] -- Must have NO insurance coverage whatsoever (no Medicaid/ Medicare, etc.) -- The pt. MUST have a primary care doctor that directs their care regularly and follows them in the community   [facility]  [phone]   [employer]  [phone]     Dental Care: Organization         Address  Phone  Notes  [facility] [address], [place] [phone] Accepts children up to age 50 who are enrolled in [place] or [place] Health Choice; pregnant women with a Medicaid card; and children who have applied for Medicaid or [place] Health Choice, but were declined, whose parents can pay a reduced fee at time of service.  [facility]  of [facility]  [address], [place] [phone] Accepts children up to age 87 who are enrolled in Medicaid or [place] Health Choice; pregnant women with a Medicaid card; and children who have applied for Medicaid or [place] Health Choice, but were declined, whose parents can pay a reduced fee at time of service.  [facility]  [address] [place], [place] [phone] Patients are seen by appointment only. Walk-ins are not accepted. [facility] will see patients 37  years of age and older. Monday - Tuesday (8am-5pm) Most Wednesdays (8:30-5pm) $30 per visit, cash only  [facility]  [address], [facility] [phone] Patients are seen by appointment only. Walk-ins are not accepted. [facility] will see patients 31 years of age and older. One Wednesday Evening (Monthly: Volunteer Based).  $30 per visit, cash only  [employer]  [phone] for adults; Children under age 81, call Graduate Pediatric Dentistry at [phone]. Children aged 30-14, please call [phone] to request a pediatric application.  Dental services are provided in all areas of dental care including fillings, crowns and bridges, complete and partial dentures, implants, gum treatment, root canals, and extractions. Preventive care is also provided. Treatment is provided to both adults and children. Patients are selected via a lottery and there is often a waiting list.   [facility] [address], [place]  [phone] [email or web address]   [facility] [address] [place] [place], [place] [phone], Ext. [phone] Second and Fourth Thursday of each month, opens at 6:30 AM; Clinic ends at 9 AM.  Patients are seen on a first-come first-served basis, and a limited number are seen during each clinic.   [facility]  [address] [place], [place] [phone]   Eligibility Requirements You must have lived in [place], [place], or [place] counties for at least the last three months.   You cannot be eligible for state or federal sponsored [place], including [employer], [place], or [employer].   You generally cannot be eligible for healthcare insurance through your employer.    How to apply: Eligibility screenings are held every Tuesday and Wednesday afternoon from 1:00 pm until 4:00 pm. You do not need an appointment for the interview!  [facility]  [address] [place], [place] [phone]   [facility] Department  [phone]   [facility] Department  [phone]   [facility]  [phone]       Dental Pain A tooth ache may be caused by cavities (tooth decay). Cavities expose the nerve of the tooth to air and hot or cold temperatures. It may come from an infection or abscess (also called a boil or furuncle) around your tooth. It is also often caused by dental caries (tooth decay). This causes the pain you are having. DIAGNOSIS  Your caregiver can diagnose this problem by exam. TREATMENT   If caused by an infection, it may be treated with medications which kill germs (antibiotics) and pain medications as prescribed by your caregiver. Take medications as directed.  Only take over-the-counter or prescription medicines for pain, discomfort, or fever as directed by your caregiver.  Whether the tooth ache today is caused by infection or dental disease, you should see your dentist as soon as possible for further care. SEEK MEDICAL CARE IF: The exam and treatment you received today has  been provided on an emergency basis only. This is not a substitute for complete medical or dental care. If your problem worsens or new problems (symptoms) appear, and you are unable to meet with your dentist, call or return to this location. SEEK IMMEDIATE MEDICAL CARE IF:   You have a fever.  You develop redness and swelling of your face, jaw, or neck.  You are unable to open your mouth.  You have severe pain uncontrolled by pain medicine. MAKE SURE YOU:   Understand these instructions.  Will watch your condition.  Will get help right away if you are not doing well or get worse. Document Released: 05/27/2005 Document Revised: 08/19/2011 Document Reviewed: 01/13/2008 [facility] Patient Information 2015 [employer] [place]. This information is not intended to replace advice given to you by your  health care provider. Make sure you discuss any questions you have with your health care provider.

## 2014-05-30 ENCOUNTER — Encounter: Payer: Self-pay | Admitting: Family Medicine

## 2015-05-19 ENCOUNTER — Encounter (HOSPITAL_COMMUNITY): Payer: Self-pay

## 2015-05-19 ENCOUNTER — Emergency Department (HOSPITAL_COMMUNITY)
Admission: EM | Admit: 2015-05-19 | Discharge: 2015-05-19 | Disposition: A | Discharge status: Home / Self Care | Payer: 59 | Attending: Emergency Medicine | Admitting: Emergency Medicine

## 2015-05-19 ENCOUNTER — Emergency Department (HOSPITAL_COMMUNITY): Payer: 59

## 2015-05-19 DIAGNOSIS — Z8659 Personal history of other mental and behavioral disorders: Secondary | ICD-10-CM | POA: Diagnosis not present

## 2015-05-19 DIAGNOSIS — J209 Acute bronchitis, unspecified: Secondary | ICD-10-CM | POA: Insufficient documentation

## 2015-05-19 DIAGNOSIS — R112 Nausea with vomiting, unspecified: Secondary | ICD-10-CM | POA: Diagnosis not present

## 2015-05-19 DIAGNOSIS — I252 Old myocardial infarction: Secondary | ICD-10-CM | POA: Insufficient documentation

## 2015-05-19 DIAGNOSIS — F17210 Nicotine dependence, cigarettes, uncomplicated: Secondary | ICD-10-CM | POA: Diagnosis not present

## 2015-05-19 DIAGNOSIS — R509 Fever, unspecified: Secondary | ICD-10-CM | POA: Diagnosis present

## 2015-05-19 DIAGNOSIS — Z7982 Long term (current) use of aspirin: Secondary | ICD-10-CM | POA: Diagnosis not present

## 2015-05-19 DIAGNOSIS — J40 Bronchitis, not specified as acute or chronic: Secondary | ICD-10-CM

## 2015-05-19 HISTORY — DX: Acute myocardial infarction, unspecified: I21.9

## 2015-05-19 LAB — COMPREHENSIVE METABOLIC PANEL
ALK PHOS: 102 U/L (ref 38–126)
ALT: 34 U/L (ref 17–63)
AST: 31 U/L (ref 15–41)
Albumin: 3.9 g/dL (ref 3.5–5.0)
Anion gap: 10 (ref 5–15)
BILIRUBIN TOTAL: 0.7 mg/dL (ref 0.3–1.2)
BUN: 9 mg/dL (ref 6–20)
CO2: 24 mmol/L (ref 22–32)
CREATININE: 0.95 mg/dL (ref 0.61–1.24)
Calcium: 8.9 mg/dL (ref 8.9–10.3)
Chloride: 105 mmol/L (ref 101–111)
Glucose, Bld: 116 mg/dL — ABNORMAL HIGH (ref 65–99)
Potassium: 3.7 mmol/L (ref 3.5–5.1)
Sodium: 139 mmol/L (ref 135–145)
TOTAL PROTEIN: 6.4 g/dL — AB (ref 6.5–8.1)

## 2015-05-19 LAB — CBC WITH DIFFERENTIAL/PLATELET
Basophils Absolute: 0 10*3/uL (ref 0.0–0.1)
Basophils Relative: 0 %
Eosinophils Absolute: 0 10*3/uL (ref 0.0–0.7)
Eosinophils Relative: 1 %
HEMATOCRIT: 43.3 % (ref 39.0–52.0)
HEMOGLOBIN: 14.6 g/dL (ref 13.0–17.0)
LYMPHS ABS: 0.8 10*3/uL (ref 0.7–4.0)
LYMPHS PCT: 15 %
MCH: 30.1 pg (ref 26.0–34.0)
MCHC: 33.7 g/dL (ref 30.0–36.0)
MCV: 89.3 fL (ref 78.0–100.0)
MONO ABS: 0.7 10*3/uL (ref 0.1–1.0)
MONOS PCT: 13 %
NEUTROS ABS: 3.9 10*3/uL (ref 1.7–7.7)
NEUTROS PCT: 71 %
Platelets: 138 10*3/uL — ABNORMAL LOW (ref 150–400)
RBC: 4.85 MIL/uL (ref 4.22–5.81)
RDW: 13.2 % (ref 11.5–15.5)
WBC: 5.5 10*3/uL (ref 4.0–10.5)

## 2015-05-19 LAB — URINALYSIS, ROUTINE W REFLEX MICROSCOPIC
Bilirubin Urine: NEGATIVE
Glucose, UA: NEGATIVE mg/dL
Hgb urine dipstick: NEGATIVE
Ketones, ur: NEGATIVE mg/dL
Leukocytes, UA: NEGATIVE
NITRITE: NEGATIVE
Protein, ur: NEGATIVE mg/dL
Specific Gravity, Urine: 1.013 (ref 1.005–1.030)
pH: 7 (ref 5.0–8.0)

## 2015-05-19 MED ORDER — IPRATROPIUM-ALBUTEROL 0.5-2.5 (3) MG/3ML IN SOLN
3.0000 mL | Freq: Once | RESPIRATORY_TRACT | Status: AC
  Administered 2015-05-19: 3 mL via RESPIRATORY_TRACT
  Filled 2015-05-19: qty 3

## 2015-05-19 MED ORDER — KETOROLAC TROMETHAMINE 30 MG/ML IJ SOLN
30.0000 mg | Freq: Once | INTRAMUSCULAR | Status: AC
  Administered 2015-05-19: 30 mg via INTRAVENOUS
  Filled 2015-05-19: qty 1

## 2015-05-19 MED ORDER — DEXAMETHASONE 4 MG PO TABS
10.0000 mg | ORAL_TABLET | Freq: Once | ORAL | Status: AC
  Administered 2015-05-19: 10 mg via ORAL
  Filled 2015-05-19: qty 3

## 2015-05-19 MED ORDER — SODIUM CHLORIDE 0.9 % IV BOLUS (SEPSIS)
1000.0000 mL | Freq: Once | INTRAVENOUS | Status: AC
  Administered 2015-05-19: 1000 mL via INTRAVENOUS

## 2015-05-19 MED ORDER — ALBUTEROL SULFATE HFA 108 (90 BASE) MCG/ACT IN AERS: 1.0000 | INHALATION_SPRAY | Freq: Four times a day (QID) | RESPIRATORY_TRACT | Status: DC | PRN

## 2015-05-19 MED ORDER — DEXAMETHASONE 2 MG PO TABS: 10.0000 mg | ORAL_TABLET | Freq: Once | ORAL | Status: DC

## 2015-05-19 MED ORDER — DOXYCYCLINE HYCLATE 100 MG PO CAPS: 100.0000 mg | ORAL_CAPSULE | Freq: Two times a day (BID) | ORAL | Status: DC

## 2015-05-19 NOTE — ED Notes (Signed)
Pt here with c/o generalized body aches, fever, chest pain, shortness of breath, and n/v. He reports the symptoms first started 3 days ago. Pt reports fever of 102 at home yesterday and has been taking Motrin.

## 2015-05-19 NOTE — ED Provider Notes (Signed)
CSN: [id number]     Arrival date & time 05/19/15  0755 History   First MD Initiated Contact with Patient 05/19/15 0805     Chief Complaint  Patient presents with  . Generalized Body Aches  . Fever     (Consider location/radiation/quality/duration/timing/severity/associated sxs/prior Treatment) HPI Comments: 49 y.o. Male with history of bipolar disorder, chest pain presents for cough, congestion, fever.  The patient reports that the symptoms started 3 days ago.  The patient reports that his son has had similar symptoms and he was diagnosed with bronchitis.  Patient is a daily smoker.  The patient reports that he has also been vomiting and nauseous but denies abdominal pain.  The patient has been taking Motrin and Advil for symptoms but continued to feel unwell and dehydrated.  He reports that he had a temperature up to 102 F.  Patient is a 49 y.o. male presenting with fever.  Fever Associated symptoms: congestion, cough, myalgias, nausea, rhinorrhea and vomiting   Associated symptoms: no chest pain, no chills, no diarrhea, no dysuria, no ear pain, no headaches and no rash     Past Medical History  Diagnosis Date  . Bipolar 1 disorder (HCC)   . Chest pain     [facility]. 05/23/14. No decreased activity in the left ventricle on stress imaging to suggest reversible ischemia or infarction. Left Ventricular Ejection Fraction: 52%  . MI (myocardial infarction) (HCC)    History reviewed. No pertinent past surgical history. History reviewed. No pertinent family history. Social History  Substance Use Topics  . Smoking status: Current Every Day Smoker    Types: Cigarettes  . Smokeless tobacco: None  . Alcohol Use: No    Review of Systems  Constitutional: Positive for fever. Negative for chills and fatigue.  HENT: Positive for congestion, postnasal drip, rhinorrhea and sinus pressure. Negative for ear discharge and ear pain.   Eyes: Negative for pain, discharge and redness.   Respiratory: Positive for cough and shortness of breath. Negative for chest tightness.   Cardiovascular: Negative for chest pain and palpitations.  Gastrointestinal: Positive for nausea and vomiting. Negative for abdominal pain, diarrhea and constipation.  Genitourinary: Negative for dysuria, urgency, frequency and hematuria.  Musculoskeletal: Positive for myalgias. Negative for back pain.  Skin: Negative for rash.  Neurological: Negative for dizziness, seizures, weakness, numbness and headaches.  Hematological: Does not bruise/bleed easily.      Allergies  Review of patient's allergies indicates no known allergies.  Home Medications   Prior to Admission medications   Medication Sig Start Date End Date Taking? Authorizing Provider  aspirin 81 MG tablet Take 81 mg by mouth daily.   Yes Historical Provider, MD  ibuprofen (ADVIL,MOTRIN) 200 MG tablet Take 800 mg by mouth every 6 (six) hours as needed (pain).   Yes Historical Provider, MD  albuterol (PROVENTIL HFA;VENTOLIN HFA) 108 (90 BASE) MCG/ACT inhaler Inhale 1-2 puffs into the lungs every 6 (six) hours as needed for wheezing or shortness of breath (cough). 05/19/15   Leta BaptistEmily Roe Lilymae Swiech, MD  dexamethasone (DECADRON) 2 MG tablet Take 5 tablets (10 mg total) by mouth once. Take as a single dose tomorrow morning (Saturday) after breakfast 05/19/15   Leta BaptistEmily Roe Rony Ratz, MD  doxycycline (VIBRAMYCIN) 100 MG capsule Take 1 capsule (100 mg total) by mouth 2 (two) times daily. 05/19/15   Leta BaptistEmily Roe Yoanna Jurczyk, MD   BP 139/74 mmHg  Pulse 73  Temp(Src) 98.9 F (37.2 C) (Oral)  Resp 18  SpO2 98% Physical  Exam  Constitutional: He is oriented to person, place, and time. He appears well-developed and well-nourished. He appears ill. No distress.  HENT:  Head: Normocephalic and atraumatic.  Right Ear: External ear normal.  Left Ear: External ear normal.  Nose: Mucosal edema and rhinorrhea present. Right sinus exhibits maxillary sinus tenderness and  frontal sinus tenderness. Left sinus exhibits maxillary sinus tenderness and frontal sinus tenderness.  Mouth/Throat: Oropharynx is clear and moist. No oropharyngeal exudate, posterior oropharyngeal edema, posterior oropharyngeal erythema or tonsillar abscesses.  Eyes: EOM are normal. Pupils are equal, round, and reactive to light.  Neck: Normal range of motion. Neck supple.  Cardiovascular: Normal rate, regular rhythm, normal heart sounds and intact distal pulses.   No murmur heard. Pulmonary/Chest: Effort normal. No respiratory distress. He has no wheezes. He has no rales.  Abdominal: Soft. He exhibits no distension. There is no tenderness.  Musculoskeletal: He exhibits no edema.  Neurological: He is alert and oriented to person, place, and time.  Skin: Skin is warm and dry. No rash noted. He is not diaphoretic.  Vitals reviewed.   ED Course  Procedures (including critical care time) Labs Review Labs Reviewed  CBC WITH DIFFERENTIAL/PLATELET - Abnormal; Notable for the following:    Platelets 138 (*)    All other components within normal limits  COMPREHENSIVE METABOLIC PANEL - Abnormal; Notable for the following:    Glucose, Bld 116 (*)    Total Protein 6.4 (*)    All other components within normal limits  URINALYSIS, ROUTINE W REFLEX MICROSCOPIC (NOT AT [facility])    Imaging Review Dg Chest 2 View  05/19/2015  CLINICAL DATA:  49 year old male with history of cough, fever and body aches for the past 3 days. EXAM: CHEST  2 VIEW COMPARISON:  Chest x-ray 05/07/2011. FINDINGS: Mild diffuse peribronchial cuffing. Lung volumes are normal. No consolidative airspace disease. No pleural effusions. No pneumothorax. No pulmonary nodule or mass noted. Pulmonary vasculature and the cardiomediastinal silhouette are within normal limits. IMPRESSION: 1. Mild diffuse peribronchial cuffing suggestive of mild acute bronchitis. Electronically Signed   By: Trudie Reed M.D.   On: 05/19/2015 08:54   I have  personally reviewed and evaluated these images and lab results as part of my medical decision-making.   EKG Interpretation   Date/Time:  Friday May 19 2015 08:06:21 EST Ventricular Rate:  87 PR Interval:  133 QRS Duration: 92 QT Interval:  374 QTC Calculation: 450 R Axis:   36 Text Interpretation:  Sinus rhythm Borderline low voltage, extremity leads  No significant change since last tracing Confirmed by Yzabelle Calles  ([id number]) on 05/19/2015 8:10:11 AM      MDM  Patient seen and evaluated in stable condition.  Afebrile on presentation.  Presentation concerning for respiratory infection.  No pneumonia on Xray.  Patient given decadron, toradol, breathing treatment, IV fluids.  He was able to tolerate PO without difficulty.  He was discharged home in stable condition with prescriptions for second dose of decadron, albuterol HFA, doxycycline,and instruction to rest and follow up with PCP outpatient.  Strict return precautions were given. Final diagnoses:  Bronchitis    1. Bronchitis, acute    Leta Baptist, MD 05/20/15 [phone]

## 2015-05-19 NOTE — Discharge Instructions (Signed)
You were seen today for your fever, cough and congestion.  Take the medications as prescribed.  Follow up for reevaluation for your symptoms with your primary care physician.  Rest at home and drink plenty of fluids to keep yourself well hydrated.  Acute Bronchitis Bronchitis is inflammation of the airways that extend from the windpipe into the lungs (bronchi). The inflammation often causes mucus to develop. This leads to a cough, which is the most common symptom of bronchitis.  In acute bronchitis, the condition usually develops suddenly and goes away over time, usually in a couple weeks. Smoking, allergies, and asthma can make bronchitis worse. Repeated episodes of bronchitis may cause further lung problems.  CAUSES Acute bronchitis is most often caused by the same virus that causes a cold. The virus can spread from person to person (contagious) through coughing, sneezing, and touching contaminated objects. SIGNS AND SYMPTOMS   Cough.   Fever.   Coughing up mucus.   Body aches.   Chest congestion.   Chills.   Shortness of breath.   Sore throat.  DIAGNOSIS  Acute bronchitis is usually diagnosed through a physical exam. Your health care provider will also ask you questions about your medical history. Tests, such as chest X-rays, are sometimes done to rule out other conditions.  TREATMENT  Acute bronchitis usually goes away in a couple weeks. Oftentimes, no medical treatment is necessary. Medicines are sometimes given for relief of fever or cough. Antibiotic medicines are usually not needed but may be prescribed in certain situations. In some cases, an inhaler may be recommended to help reduce shortness of breath and control the cough. A cool mist vaporizer may also be used to help thin bronchial secretions and make it easier to clear the chest.  HOME CARE INSTRUCTIONS  Get plenty of rest.   Drink enough fluids to keep your urine clear or pale yellow (unless you have a medical  condition that requires fluid restriction). Increasing fluids may help thin your respiratory secretions (sputum) and reduce chest congestion, and it will prevent dehydration.   Take medicines only as directed by your health care provider.  If you were prescribed an antibiotic medicine, finish it all even if you start to feel better.  Avoid smoking and secondhand smoke. Exposure to cigarette smoke or irritating chemicals will make bronchitis worse. If you are a smoker, consider using nicotine gum or skin patches to help control withdrawal symptoms. Quitting smoking will help your lungs heal faster.   Reduce the chances of another bout of acute bronchitis by washing your hands frequently, avoiding people with cold symptoms, and trying not to touch your hands to your mouth, nose, or eyes.   Keep all follow-up visits as directed by your health care provider.  SEEK MEDICAL CARE IF: Your symptoms do not improve after 1 week of treatment.  SEEK IMMEDIATE MEDICAL CARE IF:  You develop an increased fever or chills.   You have chest pain.   You have severe shortness of breath.  You have bloody sputum.   You develop dehydration.  You faint or repeatedly feel like you are going to pass out.  You develop repeated vomiting.  You develop a severe headache. MAKE SURE YOU:   Understand these instructions.  Will watch your condition.  Will get help right away if you are not doing well or get worse.   This information is not intended to replace advice given to you by your health care provider. Make sure you discuss any questions  you have with your health care provider.   Document Released: 07/04/2004 Document Revised: 06/17/2014 Document Reviewed: 11/17/2012 Elsevier Interactive Patient Education [employer].

## 2015-06-22 ENCOUNTER — Emergency Department (HOSPITAL_COMMUNITY): Payer: 59

## 2015-06-22 ENCOUNTER — Encounter (HOSPITAL_COMMUNITY): Payer: Self-pay | Admitting: Emergency Medicine

## 2015-06-22 ENCOUNTER — Ambulatory Visit (INDEPENDENT_AMBULATORY_CARE_PROVIDER_SITE_OTHER): Payer: 59 | Admitting: Family Medicine

## 2015-06-22 ENCOUNTER — Emergency Department (HOSPITAL_COMMUNITY)
Admission: EM | Admit: 2015-06-22 | Discharge: 2015-06-22 | Disposition: A | Discharge status: Home / Self Care | Payer: 59 | Attending: Emergency Medicine | Admitting: Emergency Medicine

## 2015-06-22 VITALS — BP 123/78 | HR 97

## 2015-06-22 DIAGNOSIS — Z8659 Personal history of other mental and behavioral disorders: Secondary | ICD-10-CM | POA: Diagnosis not present

## 2015-06-22 DIAGNOSIS — I252 Old myocardial infarction: Secondary | ICD-10-CM | POA: Diagnosis not present

## 2015-06-22 DIAGNOSIS — R079 Chest pain, unspecified: Secondary | ICD-10-CM | POA: Insufficient documentation

## 2015-06-22 DIAGNOSIS — F17210 Nicotine dependence, cigarettes, uncomplicated: Secondary | ICD-10-CM | POA: Diagnosis not present

## 2015-06-22 DIAGNOSIS — R0600 Dyspnea, unspecified: Secondary | ICD-10-CM

## 2015-06-22 DIAGNOSIS — Z7982 Long term (current) use of aspirin: Secondary | ICD-10-CM | POA: Diagnosis not present

## 2015-06-22 DIAGNOSIS — R0789 Other chest pain: Secondary | ICD-10-CM | POA: Diagnosis not present

## 2015-06-22 DIAGNOSIS — I10 Essential (primary) hypertension: Secondary | ICD-10-CM | POA: Diagnosis not present

## 2015-06-22 DIAGNOSIS — Z720 Tobacco use: Secondary | ICD-10-CM

## 2015-06-22 HISTORY — DX: Essential (primary) hypertension: I10

## 2015-06-22 LAB — CBC
HCT: 43.4 % (ref 39.0–52.0)
HEMOGLOBIN: 14.8 g/dL (ref 13.0–17.0)
MCH: 29.9 pg (ref 26.0–34.0)
MCHC: 34.1 g/dL (ref 30.0–36.0)
MCV: 87.7 fL (ref 78.0–100.0)
Platelets: 201 10*3/uL (ref 150–400)
RBC: 4.95 MIL/uL (ref 4.22–5.81)
RDW: 12.9 % (ref 11.5–15.5)
WBC: 8.5 10*3/uL (ref 4.0–10.5)

## 2015-06-22 LAB — I-STAT TROPONIN, ED
Troponin i, poc: 0 ng/mL (ref 0.00–0.08)
Troponin i, poc: 0 ng/mL (ref 0.00–0.08)

## 2015-06-22 LAB — BASIC METABOLIC PANEL
ANION GAP: 12 (ref 5–15)
BUN: 15 mg/dL (ref 6–20)
CHLORIDE: 106 mmol/L (ref 101–111)
CO2: 22 mmol/L (ref 22–32)
Calcium: 9.4 mg/dL (ref 8.9–10.3)
Creatinine, Ser: 0.79 mg/dL (ref 0.61–1.24)
Glucose, Bld: 109 mg/dL — ABNORMAL HIGH (ref 65–99)
POTASSIUM: 3.8 mmol/L (ref 3.5–5.1)
SODIUM: 140 mmol/L (ref 135–145)

## 2015-06-22 MED ORDER — GI COCKTAIL ~~LOC~~
30.0000 mL | Freq: Once | ORAL | Status: AC
  Administered 2015-06-22: 30 mL via ORAL
  Filled 2015-06-22: qty 30

## 2015-06-22 MED ORDER — DIPHENHYDRAMINE HCL 50 MG/ML IJ SOLN
25.0000 mg | Freq: Once | INTRAMUSCULAR | Status: AC
  Administered 2015-06-22: 25 mg via INTRAVENOUS
  Filled 2015-06-22: qty 1

## 2015-06-22 MED ORDER — PROCHLORPERAZINE EDISYLATE 5 MG/ML IJ SOLN
10.0000 mg | Freq: Once | INTRAMUSCULAR | Status: AC
  Administered 2015-06-22: 10 mg via INTRAVENOUS
  Filled 2015-06-22: qty 2

## 2015-06-22 MED ORDER — SODIUM CHLORIDE 0.9 % IV BOLUS (SEPSIS)
1000.0000 mL | Freq: Once | INTRAVENOUS | Status: AC
  Administered 2015-06-22: 1000 mL via INTRAVENOUS

## 2015-06-22 NOTE — ED Provider Notes (Signed)
CSN: [id number]     Arrival date & time 06/22/15  1306 History   First MD Initiated Contact with Patient 06/22/15 1309     Chief Complaint  Patient presents with  . Chest Pain     (Consider location/radiation/quality/duration/timing/severity/associated sxs/prior Treatment) Patient is a 50 y.o. male presenting with chest pain. The history is provided by the patient.  Chest Pain Pain location:  Substernal area Pain quality: pressure   Pain radiates to:  Does not radiate Pain radiates to the back: no   Pain severity:  Moderate Onset quality:  Sudden Duration:  2 hours Timing:  Constant Progression:  Unchanged Chronicity:  New Relieved by:  Nothing Worsened by:  Nothing tried Ineffective treatments:  None tried Associated symptoms: no abdominal pain, no fever, no headache, no palpitations, no shortness of breath and not vomiting    50 yo M with a cc of chest pain.  Started about 2 hours ago, went to urgent care, sent here for further eval.  patient states the pain is substernal feels like a pressure. Denies radiation. Is worse with cough and deep inspiration. Patient has had URI-like symptoms for the past couple days. Having fevers and chills. Had an episode that was similar to this back in 2015 had a negative nuclear medicine stress test at that time. Patient denies prior catheterization. Denies hypertension hyperlipidemia diabetes. Positive family history. Denies history of prior PE.   Past Medical History  Diagnosis Date  . Bipolar 1 disorder (HCC)   . Chest pain     [facility]. 05/23/14. No decreased activity in the left ventricle on stress imaging to suggest reversible ischemia or infarction. Left Ventricular Ejection Fraction: 52%  . MI (myocardial infarction) (HCC)   . Hypertension    History reviewed. No pertinent past surgical history. No family history on file. Social History  Substance Use Topics  . Smoking status: Current Every Day Smoker -- 1.50 packs/day    Types: Cigarettes  . Smokeless tobacco: None  . Alcohol Use: No    Review of Systems  Constitutional: Negative for fever and chills.  HENT: Negative for congestion and facial swelling.   Eyes: Negative for discharge and visual disturbance.  Respiratory: Negative for shortness of breath.   Cardiovascular: Positive for chest pain. Negative for palpitations.  Gastrointestinal: Negative for vomiting, abdominal pain and diarrhea.  Musculoskeletal: Negative for myalgias and arthralgias.  Skin: Negative for color change and rash.  Neurological: Negative for tremors, syncope and headaches.  Psychiatric/Behavioral: Negative for confusion and dysphoric mood.      Allergies  Review of patient's allergies indicates no known allergies.  Home Medications   Prior to Admission medications   Medication Sig Start Date End Date Taking? Authorizing Provider  aspirin EC 81 MG tablet Take 81 mg by mouth daily.   Yes Historical Provider, MD  ibuprofen (ADVIL,MOTRIN) 200 MG tablet Take 800 mg by mouth every 6 (six) hours as needed (pain).   Yes Historical Provider, MD  albuterol (PROVENTIL HFA;VENTOLIN HFA) 108 (90 BASE) MCG/ACT inhaler Inhale 1-2 puffs into the lungs every 6 (six) hours as needed for wheezing or shortness of breath (cough). Patient not taking: Reported on 06/22/2015 05/19/15   Leta Baptist, MD  aspirin 81 MG tablet Take 81 mg by mouth daily.    Historical Provider, MD  dexamethasone (DECADRON) 2 MG tablet Take 5 tablets (10 mg total) by mouth once. Take as a single dose tomorrow morning (Saturday) after breakfast Patient not taking: Reported on  06/22/2015 05/19/15   Leta Baptist, MD  doxycycline (VIBRAMYCIN) 100 MG capsule Take 1 capsule (100 mg total) by mouth 2 (two) times daily. Patient not taking: Reported on 06/22/2015 05/19/15   Leta Baptist, MD   Ht 5\' 9"  (1.753 m)  Wt 205 lb (92.987 kg)  BMI 30.26 kg/m2 Physical Exam  Constitutional: He is oriented to person, place,  and time. He appears well-developed and well-nourished.  HENT:  Head: Normocephalic and atraumatic.  Eyes: EOM are normal. Pupils are equal, round, and reactive to light.  Neck: Normal range of motion. Neck supple. No JVD present.  Cardiovascular: Normal rate and regular rhythm.  Exam reveals no gallop and no friction rub.   No murmur heard. Pulmonary/Chest: No respiratory distress. He has no wheezes.  Abdominal: He exhibits no distension. There is no tenderness. There is no rebound and no guarding.  Musculoskeletal: Normal range of motion.  Neurological: He is alert and oriented to person, place, and time.  Skin: No rash noted. No pallor.  Psychiatric: He has a normal mood and affect. His behavior is normal.  Nursing note and vitals reviewed.   ED Course  Procedures (including critical care time) Labs Review Labs Reviewed  BASIC METABOLIC PANEL - Abnormal; Notable for the following:    Glucose, Bld 109 (*)    All other components within normal limits  CBC  I-STAT TROPOININ, ED    Imaging Review Dg Chest 2 View  06/22/2015  CLINICAL DATA:  Shortness of breath and chest pain today. Initial encounter. EXAM: CHEST  2 VIEW COMPARISON:  PA and lateral chest 05/19/2015 and 05/07/2011. FINDINGS: The lungs are clear. Heart size is upper normal. No pneumothorax or pleural effusion. No focal bony abnormality. IMPRESSION: No acute disease. Electronically Signed   By: Drusilla Kanner M.D.   On: 06/22/2015 14:18   I have personally reviewed and evaluated these images and lab results as part of my medical decision-making.   EKG Interpretation   Date/Time:  Thursday June 22 2015 13:11:23 EST Ventricular Rate:  76 PR Interval:  133 QRS Duration: 88 QT Interval:  379 QTC Calculation: 426 R Axis:   45 Text Interpretation:  Sinus rhythm No significant change since last  tracing Confirmed by Alyza Artiaga MD, Reuel Boom ([id number]) on 06/22/2015 1:25:18 PM      MDM   Final diagnoses:  Chest pain,  unspecified chest pain type    50 yo M with a chief complaint chest pain. Wells score 0. Low risk.  Initial trop negative.  Repeat in 3 hours.  Likely bronchitis base on hx.   Delta trop negative, d/c home PCP follow up.   I have discussed the diagnosis/risks/treatment options with the patient and family and believe the pt to be eligible for discharge home to follow-up with PCP. We also discussed returning to the ED immediately if new or worsening sx occur. We discussed the sx which are most concerning (e.g., sudden worsening pain, fever, syncope) that necessitate immediate return. Medications administered to the patient during their visit and any new prescriptions provided to the patient are listed below.  Medications given during this visit Medications  prochlorperazine (COMPAZINE) injection 10 mg (10 mg Intravenous Given 06/22/15 1346)  diphenhydrAMINE (BENADRYL) injection 25 mg (25 mg Intravenous Given 06/22/15 1346)  gi cocktail (Maalox,Lidocaine,Donnatal) (30 mLs Oral Given 06/22/15 1346)  sodium chloride 0.9 % bolus 1,000 mL (0 mLs Intravenous Stopped 06/22/15 1624)    Discharge Medication List as of 06/22/2015  4:19 PM  The patient appears reasonably screen and/or stabilized for discharge and I doubt any other medical condition or other [facility] requiring further screening, evaluation, or treatment in the ED at this time prior to discharge.      Melene Planan Zaim Nitta, DO 06/22/15 1800

## 2015-06-22 NOTE — Discharge Instructions (Signed)
Take 4 over the counter ibuprofen tablets 3 times a day or 2 over-the-counter naproxen tablets twice a day for pain. ° °Nonspecific Chest Pain  °Chest pain can be caused by many different conditions. There is always a chance that your pain could be related to something serious, such as a heart attack or a blood clot in your lungs. Chest pain can also be caused by conditions that are not life-threatening. If you have chest pain, it is very important to follow up with your health care provider. °CAUSES  °Chest pain can be caused by: °· Heartburn. °· Pneumonia or bronchitis. °· Anxiety or stress. °· Inflammation around your heart (pericarditis) or lung (pleuritis or pleurisy). °· A blood clot in your lung. °· A collapsed lung (pneumothorax). It can develop suddenly on its own (spontaneous pneumothorax) or from trauma to the chest. °· Shingles infection (varicella-zoster virus). °· Heart attack. °· Damage to the bones, muscles, and cartilage that make up your chest wall. This can include: °¨ Bruised bones due to injury. °¨ Strained muscles or cartilage due to frequent or repeated coughing or overwork. °¨ Fracture to one or more ribs. °¨ Sore cartilage due to inflammation (costochondritis). °RISK FACTORS  °Risk factors for chest pain may include: °· Activities that increase your risk for trauma or injury to your chest. °· Respiratory infections or conditions that cause frequent coughing. °· Medical conditions or overeating that can cause heartburn. °· Heart disease or family history of heart disease. °· Conditions or health behaviors that increase your risk of developing a blood clot. °· Having had chicken pox (varicella zoster). °SIGNS AND SYMPTOMS °Chest pain can feel like: °· Burning or tingling on the surface of your chest or deep in your chest. °· Crushing, pressure, aching, or squeezing pain. °· Dull or sharp pain that is worse when you move, cough, or take a deep breath. °· Pain that is also felt in your back,  neck, shoulder, or arm, or pain that spreads to any of these areas. °Your chest pain may come and go, or it may stay constant. °DIAGNOSIS °Lab tests or other studies may be needed to find the cause of your pain. Your health care provider may have you take a test called an ambulatory ECG (electrocardiogram). An ECG records your heartbeat patterns at the time the test is performed. You may also have other tests, such as: °· Transthoracic echocardiogram (TTE). During echocardiography, sound waves are used to create a picture of all of the heart structures and to look at how blood flows through your heart. °· Transesophageal echocardiogram (TEE). This is a more advanced imaging test that obtains images from inside your body. It allows your health care provider to see your heart in finer detail. °· Cardiac monitoring. This allows your health care provider to monitor your heart rate and rhythm in real time. °· Holter monitor. This is a portable device that records your heartbeat and can help to diagnose abnormal heartbeats. It allows your health care provider to track your heart activity for several days, if needed. °· Stress tests. These can be done through exercise or by taking medicine that makes your heart beat more quickly. °· Blood tests. °· Imaging tests. °TREATMENT  °Your treatment depends on what is causing your chest pain. Treatment may include: °· Medicines. These may include: °¨ Acid blockers for heartburn. °¨ Anti-inflammatory medicine. °¨ Pain medicine for inflammatory conditions. °¨ Antibiotic medicine, if an infection is present. °¨ Medicines to dissolve blood clots. °¨ Medicines to   treat coronary artery disease.  Supportive care for conditions that do not require medicines. This may include:  Resting.  Applying heat or cold packs to injured areas.  Limiting activities until pain decreases. HOME CARE INSTRUCTIONS  If you were prescribed an antibiotic medicine, finish it all even if you start to  feel better.  Avoid any activities that bring on chest pain.  Do not use any tobacco products, including cigarettes, chewing tobacco, or electronic cigarettes. If you need help quitting, ask your health care provider.  Do not drink alcohol.  Take medicines only as directed by your health care provider.  Keep all follow-up visits as directed by your health care provider. This is important. This includes any further testing if your chest pain does not go away.  If heartburn is the cause for your chest pain, you may be told to keep your head raised (elevated) while sleeping. This reduces the chance that acid will go from your stomach into your esophagus.  Make lifestyle changes as directed by your health care provider. These may include:  Getting regular exercise. Ask your health care provider to suggest some activities that are safe for you.  Eating a heart-healthy diet. A registered dietitian can help you to learn healthy eating options.  Maintaining a healthy weight.  Managing diabetes, if necessary.  Reducing stress. SEEK MEDICAL CARE IF:  Your chest pain does not go away after treatment.  You have a rash with blisters on your chest.  You have a fever. SEEK IMMEDIATE MEDICAL CARE IF:   Your chest pain is worse.  You have an increasing cough, or you cough up blood.  You have severe abdominal pain.  You have severe weakness.  You faint.  You have chills.  You have sudden, unexplained chest discomfort.  You have sudden, unexplained discomfort in your arms, back, neck, or jaw.  You have shortness of breath at any time.  You suddenly start to sweat, or your skin gets clammy.  You feel nauseous or you vomit.  You suddenly feel light-headed or dizzy.  Your heart begins to beat quickly, or it feels like it is skipping beats. These symptoms may represent a serious problem that is an emergency. Do not wait to see if the symptoms will go away. Get medical help right  away. Call your local emergency services (911 in the [place] Do not drive yourself to the hospital.   This information is not intended to replace advice given to you by your health care provider. Make sure you discuss any questions you have with your health care provider.   Document Released: 03/06/2005 Document Revised: 06/17/2014 Document Reviewed: 12/31/2013 Elsevier Interactive Patient Education [employer].

## 2015-06-22 NOTE — Progress Notes (Addendum)
Subjective:  By signing my name below, I, Gabriel Potter, attest that this documentation has been prepared under the direction and in the presence of Meredith Staggers, MD.  Gabriel Potter, Medical Scribe. 06/22/2015.  12:36 PM.     Patient ID: Gabriel Potter, male    DOB: 1965/10/22, 50 y.o.   MRN: [id number]  Chief Complaint  Patient presents with  . Chest Pain    30 min prior to arrival at clinic    Pulled emergently from waiting room due to symptoms of possible MI, myself and two other providers immediately attended to him in room 7.   HPI HPI Comments: Gabriel Potter is a 50 y.o. male with a hx of MI who presents to [facility] complaining of a possible MI, sudden onset 30 min ago. Pt notes that he is experiencing symptoms of a pressure chest pain with a 7-8 severity that is present across his chest and is causing him SOB. He reports that he was feeling lightheaded all day, and attributed that to lack of sleep and not eating much the night before. He was inspecting a car when his symptoms first started. He states that he initially did experience radiation of the pain to his left arm, however he is not currently experiencing this now. Pt reports a hx of MI in 2014, and notes that he did not undergo any cardiac surgeries, or stents for that. He underwent a stress test, and was advised that his elevated blood pressure is possibly due to drinking energy drinks. Pt does not follow up with a cardiologist. Pt is a smoker. He notes taking baby aspirin daily, and he did have a dose of that this morning. Pt was seen in the ED on 12/09 for bronchitis. He is still experiencing residual cough with that. He denies leg/calf swelling recently, no hx of DVT/PE, recent travel or prolonged immobility, nausea, vomiting, or diaphoresis, no focal weakness.    Patient Active Problem List   Diagnosis Date Noted  . HEMATEMESIS 08/07/2006  . VOMITING W/NAUSEA 08/07/2006   Past Medical  History  Diagnosis Date  . Bipolar 1 disorder (HCC)   . Chest pain     [facility] 05/23/14. No decreased activity in the left ventricle on stress imaging to suggest reversible ischemia or infarction. Left Ventricular Ejection Fraction: 52%  . MI (myocardial infarction) (HCC)    No past surgical history on file. No Known Allergies Prior to Admission medications   Medication Sig Start Date End Date Taking? Authorizing Provider  albuterol (PROVENTIL HFA;VENTOLIN HFA) 108 (90 BASE) MCG/ACT inhaler Inhale 1-2 puffs into the lungs every 6 (six) hours as needed for wheezing or shortness of breath (cough). 05/19/15   Leta Baptist, MD  aspirin 81 MG tablet Take 81 mg by mouth daily.    Historical Provider, MD  dexamethasone (DECADRON) 2 MG tablet Take 5 tablets (10 mg total) by mouth once. Take as a single dose tomorrow morning (Saturday) after breakfast 05/19/15   Leta Baptist, MD  doxycycline (VIBRAMYCIN) 100 MG capsule Take 1 capsule (100 mg total) by mouth 2 (two) times daily. 05/19/15   Leta Baptist, MD  ibuprofen (ADVIL,MOTRIN) 200 MG tablet Take 800 mg by mouth every 6 (six) hours as needed (pain).    Historical Provider, MD   Social History   Social History  . Marital Status: Married    Spouse Name: N/A  . Number of Children: N/A  .  Years of Education: N/A   Occupational History  . Not on file.   Social History Main Topics  . Smoking status: Current Every Day Smoker    Types: Cigarettes  . Smokeless tobacco: Not on file  . Alcohol Use: No  . Drug Use: No  . Sexual Activity:    Partners: Female   Other Topics Concern  . Not on file   Social History Narrative    Review of Systems  Constitutional: Negative for diaphoresis.  Respiratory: Positive for cough and shortness of breath.   Cardiovascular: Positive for chest pain. Negative for leg swelling.  Gastrointestinal: Negative for nausea and vomiting.      Objective:   Physical Exam  Constitutional: He  is oriented to person, place, and time. He appears well-developed and well-nourished. No distress.  No diaphoretic.  HENT:  Head: Normocephalic and atraumatic.  Eyes: EOM are normal. Pupils are equal, round, and reactive to light.  Neck: Neck supple.  Cardiovascular: Regular rhythm and normal heart sounds.  Tachycardia present.   Pulmonary/Chest: Effort normal and breath sounds normal. No respiratory distress. He has no wheezes. He has no rales. He exhibits tenderness (Tender on the left side).  Abdominal: There is tenderness.  Neurological: He is alert and oriented to person, place, and time. No cranial nerve deficit.  Moving extrimities normally.   Skin: Skin is warm and dry.  Psychiatric: He has a normal mood and affect. His behavior is normal.  Nursing note and vitals reviewed.   Filed Vitals:   06/22/15 1222  BP: 127/79  Pulse: 105    12:23 PM- IV placed on the right hand with normal saline. Placed on a heart monitor. 0.4 mg nitroglycerin SL X1. Pain decreased form 7/10 to 6/10 with initial NTG.  12:30 - NTG 0.4mg  SL (2nd dose).  Pain 6/10 at approx 12.40.  12:31 PM- EMS on scene, report given with transfer of care.   Total medications given: Nitroglycerin 0.4 mg X2, most recent dose at 12:30. Aspirin 243 mg chewable at 12:14. A right hand IV, 20 gauge, normal saline. O2 [place] 2 LPM. Pain 6/10 even after the second nitroglycerin.   EKG reading: Sinus rhythm, rate 99. No apparent ST elevation or significant change from prior.      Assessment & Plan:  Gabriel Potter is a 50 y.o. male Other chest pain - Plan: EKG 12-Lead   History of reported MI in 2014, current risk factors for cardiac disease of age, tobacco abuse. Presents with acute onset of left-sided chest pain/pressure with radiation down left arm and associated dyspnea 30 minutes prior to arrival.   -placed on heart monitor, EKG was obtained without acute signs of STEMI no apparent change from previous EKG.   -He was  placed on O2 nasal cannula 2 L,  -IV was attempted on left side and followed with a placement on the right hand with 20-gauge running normal saline.   -NTG 0.4mg  SL x2 with minimal change in pain 7/10 to 6/10.   -ASA 243mg  chewable (had 81mg  this am) given.   -[facility] for possible [facility]. Charge nurse advised.    No orders of the defined types were placed in this encounter.   There are no Patient Instructions on file for this visit.  I personally performed the services described in this documentation, which was scribed in my presence. The recorded information has been reviewed and considered, and addended by me as needed.

## 2015-06-22 NOTE — ED Notes (Signed)
Patient sent to ED from [facility].  Patient complains of chest pain that started this morning at 1000am.   Per EMS[facility].  Patient states CBG 131.   Vital signs WNL.   3 NTG SL and pain went from 7/10 to 5/10.  324 mg ASA already by patient.   Patient complaining of dizziness and headache with the chest pain.  History of MI previously.

## 2015-06-24 ENCOUNTER — Ambulatory Visit (INDEPENDENT_AMBULATORY_CARE_PROVIDER_SITE_OTHER): Payer: 59 | Admitting: Family Medicine

## 2015-06-24 VITALS — BP 132/80 | HR 82 | Temp 98.1°F | Resp 20 | Ht 69.29 in | Wt 210.0 lb

## 2015-06-24 DIAGNOSIS — J209 Acute bronchitis, unspecified: Secondary | ICD-10-CM | POA: Diagnosis not present

## 2015-06-24 DIAGNOSIS — M7711 Lateral epicondylitis, right elbow: Secondary | ICD-10-CM

## 2015-06-24 MED ORDER — HYDROCODONE-ACETAMINOPHEN 5-325 MG PO TABS: 1.0000 | ORAL_TABLET | Freq: Four times a day (QID) | ORAL | Status: DC | PRN

## 2015-06-24 MED ORDER — HYDROCODONE-ACETAMINOPHEN 7.5-325 MG/15ML PO SOLN: 10.0000 mL | Freq: Four times a day (QID) | ORAL | Status: DC | PRN

## 2015-06-24 NOTE — Patient Instructions (Signed)
Tennis Elbow Tennis elbow (lateral epicondylitis) is inflammation of the outer tendons of your forearm close to your elbow. Your tendons attach your muscles to your bones. The outer tendons of your forearm are used to extend your wrist, and they attach on the outside part of your elbow. Tennis elbow is often found in people who play tennis, but anyone may get the condition from repeatedly extending the wrist or turning the forearm. CAUSES This condition is caused by repeatedly extending your wrist and using your hands. It can result from sports or work that requires repetitive forearm movements. Tennis elbow may also be caused by an injury. RISK FACTORS You have a higher risk of developing tennis elbow if you play tennis or another racquet sport. You also have a higher risk if you frequently use your hands for work. This condition is also more likely to develop in:  Musicians.  Carpenters, painters, and plumbers.  Cooks.  Cashiers.  People who work in factories.  Construction workers.  Butchers.  People who use computers. SYMPTOMS Symptoms of this condition include:  Pain and tenderness in your forearm and the outer part of your elbow. You may only feel the pain when you use your arm, or you may feel it even when you are not using your arm.  A burning feeling that runs from your elbow through your arm.  Weak grip in your hands. DIAGNOSIS  This condition may be diagnosed by medical history and physical exam. You may also have other tests, including:  X-rays.  MRI. TREATMENT Your health care provider will recommend lifestyle adjustments, such as resting and icing your arm. Treatment may also include:  Medicines for inflammation. This may include shots of cortisone if your pain continues.  Physical therapy. This may include massage or exercises.  An elbow brace. Surgery may eventually be recommended if your pain does not go away with treatment. HOME CARE  INSTRUCTIONS Activity  Rest your elbow and wrist as directed by your health care provider. Try to avoid any activities that caused the problem until your health care provider says that you can do them again.  If a physical therapist teaches you exercises, do all of them as directed.  If you lift an object, lift it with your palm facing upward. This lowers the stress on your elbow. Lifestyle  If your tennis elbow is caused by sports, check your equipment and make sure that:  You are using it correctly.  It is the best fit for you.  If your tennis elbow is caused by work, take breaks frequently, if you are able. Talk with your manager about how to best perform tasks in a way that is safe.  If your tennis elbow is caused by computer use, talk with your manager about any changes that can be made to your work environment. General Instructions  If directed, apply ice to the painful area:  Put ice in a plastic bag.  Place a towel between your skin and the bag.  Leave the ice on for 20 minutes, 2-3 times per day.  Take medicines only as directed by your health care provider.  If you were given a brace, wear it as directed by your health care provider.  Keep all follow-up visits as directed by your health care provider. This is important. SEEK MEDICAL CARE IF:  Your pain does not get better with treatment.  Your pain gets worse.  You have numbness or weakness in your forearm, hand, or fingers.     This information is not intended to replace advice given to you by your health care provider. Make sure you discuss any questions you have with your health care provider.   Document Released: 05/27/2005 Document Revised: 10/11/2014 Document Reviewed: 05/23/2014 Elsevier Interactive Patient Education 2016 Elsevier Inc.  

## 2015-06-24 NOTE — Progress Notes (Signed)
Patient ID: Gabriel Potter, male   DOB: Dec 10, 1965, 50 y.o.   MRN: [id number]   This chart was scribed for Elvina SidleKurt Lauenstein, MD by [facility] Hashem, medical scribe at [facility].The patient was seen in exam room 02 and the patient's care was started at 8:35 AM.  Patient ID: Gabriel Potter MRN: [id number], DOB: Dec 10, 1965, 50 y.o. Date of Encounter: 06/24/2015  Primary Physician: User Centricity, MD  Chief Complaint:  Chief Complaint  Patient presents with   Follow-up    chest pain    Elbow Pain   HPI:  Gabriel Potter is a 50 y.o. male who presents to [facility] for a follow up regarding chest pain. This has improved but he does still feel congested and he is still coughing up phlegm. The congestion and cough has been ongoing for about 3 weeks. No longer light headed. He is taking mucinex which has helped.   Also having right elbow pain. Pain with certain movements. Grasping causes a shooting pain up his right arm. [employer].  Past Medical History  Diagnosis Date   Bipolar 1 disorder (HCC)    Chest pain     [facility]. 05/23/14. No decreased activity in the left ventricle on stress imaging to suggest reversible ischemia or infarction. Left Ventricular Ejection Fraction: 52%   MI (myocardial infarction) (HCC)    Hypertension     Home Meds: Prior to Admission medications   Medication Sig Start Date End Date Taking? Authorizing Provider  aspirin 81 MG tablet Take 81 mg by mouth daily.   Yes Historical Provider, MD  ibuprofen (ADVIL,MOTRIN) 200 MG tablet Take 800 mg by mouth every 6 (six) hours as needed (pain).   Yes Historical Provider, MD  albuterol (PROVENTIL HFA;VENTOLIN HFA) 108 (90 BASE) MCG/ACT inhaler Inhale 1-2 puffs into the lungs every 6 (six) hours as needed for wheezing or shortness of breath (cough). Patient not taking: Reported on 06/22/2015 05/19/15   Leta BaptistEmily Roe Nguyen, MD  dexamethasone (DECADRON) 2 MG  tablet Take 5 tablets (10 mg total) by mouth once. Take as a single dose tomorrow morning (Saturday) after breakfast Patient not taking: Reported on 06/22/2015 05/19/15   Leta BaptistEmily Roe Nguyen, MD  doxycycline (VIBRAMYCIN) 100 MG capsule Take 1 capsule (100 mg total) by mouth 2 (two) times daily. Patient not taking: Reported on 06/24/2015 05/19/15   Leta BaptistEmily Roe Nguyen, MD    Allergies: No Known Allergies  Social History   Social History   Marital Status: Married    Spouse Name: N/A   Number of Children: N/A   Years of Education: N/A   Occupational History   Not on file.   Social History Main Topics   Smoking status: Current Every Day Smoker -- 1.50 packs/day    Types: Cigarettes   Smokeless tobacco: Not on file   Alcohol Use: No   Drug Use: No   Sexual Activity:    Partners: Female   Other Topics Concern   Not on file   Social History Narrative    Review of Systems: Constitutional: negative for chills, fever, night sweats, weight changes, or fatigue  HEENT: negative for vision changes, hearing loss, rhinorrhea, ST, epistaxis, or sinus pressure. Positive for congestion Cardiovascular: negative for chest pain or palpitations Respiratory: negative for hemoptysis, wheezing, shortness of breath. Positive for cough Abdominal: negative for abdominal pain, nausea, vomiting, diarrhea, or constipation Dermatological: negative for rash Msk: Positive for arthralgias. Neurologic: negative  for headache, dizziness, or syncope All other systems reviewed and are otherwise negative with the exception to those above and in the HPI.  Physical Exam: Blood pressure 132/80, pulse 82, temperature 98.1 F (36.7 C), temperature source Oral, resp. rate 20, height 5' 9.29" (1.76 m), weight 210 lb (95.255 kg), SpO2 99 %., Body mass index is 30.75 kg/(m^2). General: Well developed, well nourished, in no acute distress. Head: Normocephalic, atraumatic, eyes without discharge, sclera non-icteric,  nares are without discharge. Bilateral auditory canals clear, TM's are without perforation, pearly grey and translucent with reflective cone of light bilaterally. Oral cavity moist, posterior pharynx without exudate, erythema, peritonsillar abscess, or post nasal drip.  Neck: Supple. No thyromegaly. Full ROM. No lymphadenopathy. Lungs: Clear bilaterally to auscultation without wheezes, rales, or rhonchi. Breathing is unlabored. Heart: RRR with S1 S2. No murmurs, rubs, or gallops appreciated. Abdomen: Soft, non-tender, non-distended with normoactive bowel sounds. No hepatomegaly. No rebound/guarding. No obvious abdominal masses. Msk:  Strength and tone normal for age. Tender over the right lateral epicondyl  Extremities/Skin: Warm and dry. No clubbing or cyanosis. No edema. No rashes or suspicious lesions. Neuro: Alert and oriented X 3. Moves all extremities spontaneously. Gait is normal. CNII-XII grossly in tact. Psych:  Responds to questions appropriately with a normal affect.   With 20 mg of Kenalog and 1/2 mL of Marcaine 0.5%. Sterile prep initiated, area pain located, injection completed without complication, good anesthesia following injection  ASSESSMENT AND PLAN:  50 y.o. year old male with acute bronchitis and right tennis elbow   By signing my name below, I, Nadim Abuhashem, attest that this documentation has been prepared under the direction and in the presence of Elvina Sidle, MD.  Electronically Signed: Conchita Paris, medical scribe. 06/24/2015 8:45 AM.   This chart was scribed in my presence and reviewed by me personally.    ICD-9-CM ICD-10-CM   1. Acute bronchitis, unspecified organism 466.0 J20.9 HYDROcodone-acetaminophen (HYCET) 7.5-325 mg/15 ml solution  2. Tennis elbow, right 726.32 M77.11 HYDROcodone-acetaminophen (NORCO) 5-325 MG tablet     Signed, Elvina Sidle, MD

## 2015-06-26 ENCOUNTER — Emergency Department (HOSPITAL_COMMUNITY)
Admission: EM | Admit: 2015-06-26 | Discharge: 2015-06-26 | Disposition: A | Discharge status: Home / Self Care | Payer: 59

## 2015-06-26 NOTE — ED Notes (Signed)
Called for triage, no answer in lobby. 

## 2015-06-26 NOTE — ED Notes (Signed)
Called for triage, no answer

## 2015-09-30 ENCOUNTER — Emergency Department (HOSPITAL_COMMUNITY)
Admission: EM | Admit: 2015-09-30 | Discharge: 2015-09-30 | Disposition: A | Discharge status: Home / Self Care | Payer: 59 | Attending: Emergency Medicine | Admitting: Emergency Medicine

## 2015-09-30 ENCOUNTER — Encounter (HOSPITAL_COMMUNITY): Payer: Self-pay | Admitting: *Deleted

## 2015-09-30 ENCOUNTER — Emergency Department (HOSPITAL_COMMUNITY): Payer: 59

## 2015-09-30 DIAGNOSIS — Z7982 Long term (current) use of aspirin: Secondary | ICD-10-CM | POA: Diagnosis not present

## 2015-09-30 DIAGNOSIS — M79642 Pain in left hand: Secondary | ICD-10-CM | POA: Diagnosis present

## 2015-09-30 DIAGNOSIS — I10 Essential (primary) hypertension: Secondary | ICD-10-CM | POA: Insufficient documentation

## 2015-09-30 DIAGNOSIS — I252 Old myocardial infarction: Secondary | ICD-10-CM | POA: Insufficient documentation

## 2015-09-30 DIAGNOSIS — F17210 Nicotine dependence, cigarettes, uncomplicated: Secondary | ICD-10-CM | POA: Insufficient documentation

## 2015-09-30 DIAGNOSIS — L03114 Cellulitis of left upper limb: Secondary | ICD-10-CM | POA: Insufficient documentation

## 2015-09-30 DIAGNOSIS — Z8659 Personal history of other mental and behavioral disorders: Secondary | ICD-10-CM | POA: Insufficient documentation

## 2015-09-30 DIAGNOSIS — Z23 Encounter for immunization: Secondary | ICD-10-CM | POA: Diagnosis not present

## 2015-09-30 MED ORDER — OXYCODONE-ACETAMINOPHEN 5-325 MG PO TABS
1.0000 | ORAL_TABLET | ORAL | Status: DC | PRN
  Administered 2015-09-30: 1 via ORAL
  Filled 2015-09-30: qty 1

## 2015-09-30 MED ORDER — OXYCODONE-ACETAMINOPHEN 5-325 MG PO TABS
ORAL_TABLET | ORAL | Status: AC
  Filled 2015-09-30: qty 1

## 2015-09-30 MED ORDER — DOXYCYCLINE HYCLATE 100 MG PO TABS: 100.0000 mg | ORAL_TABLET | Freq: Once | ORAL | Status: DC

## 2015-09-30 MED ORDER — DOXYCYCLINE HYCLATE 100 MG PO CAPS: 100.0000 mg | ORAL_CAPSULE | Freq: Two times a day (BID) | ORAL | Status: DC

## 2015-09-30 MED ORDER — TETANUS-DIPHTH-ACELL PERTUSSIS 5-2.5-18.5 LF-MCG/0.5 IM SUSP
0.5000 mL | Freq: Once | INTRAMUSCULAR | Status: AC
  Administered 2015-09-30: 0.5 mL via INTRAMUSCULAR
  Filled 2015-09-30: qty 0.5

## 2015-09-30 MED ORDER — OXYCODONE-ACETAMINOPHEN 5-325 MG PO TABS
1.0000 | ORAL_TABLET | ORAL | Status: DC | PRN
  Administered 2015-09-30: 1 via ORAL

## 2015-09-30 NOTE — ED Notes (Signed)
Pt reports onset yesterday of hand pain. Having swelling and redness today, denies fever. Pt unsure of any injury.

## 2015-09-30 NOTE — ED Provider Notes (Signed)
CSN: [id number]     Arrival date & time 09/30/15  1529 History   First MD Initiated Contact with Patient 09/30/15 1656     Chief Complaint  Patient presents with  . Hand Pain  . Cellulitis     (Consider location/radiation/quality/duration/timing/severity/associated sxs/prior Treatment) HPI Gabriel Potter is a 50 y.o. male with no significant past mental history who comes in for evaluation of left hand pain and swelling. 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 He reports shortly after work yesterday he noticed a dull pain in his right hand on the palm side between his finger and thumb. He reports the pain has slowly and gradually worsened. He reports increased redness and swelling and pain with palpation and movement of his hand. Denies any fevers, chills, numbness or tingling. Has not tried anything to improve his symptoms. No other modifying factors. Last tetanus unknown  Past Medical History  Diagnosis Date  . Bipolar 1 disorder (HCC)   . Chest pain     [facility] 05/23/14. No decreased activity in the left ventricle on stress imaging to suggest reversible ischemia or infarction. Left Ventricular Ejection Fraction: 52%  . MI (myocardial infarction) (HCC)   . Hypertension    History reviewed. No pertinent past surgical history. History reviewed. No pertinent family history. Social History  Substance Use Topics  . Smoking status: Current Every Day Smoker -- 1.50 packs/day    Types: Cigarettes  . Smokeless tobacco: None  . Alcohol Use: No    Review of Systems A 10 point review of systems was completed and was negative except for pertinent positives and negatives as mentioned in the history of present illness     Allergies  Review of patient's allergies indicates no known allergies.  Home Medications   Prior to Admission medications   Medication Sig Start Date End Date Taking? Authorizing Provider  aspirin 81 MG  tablet Take 81 mg by mouth daily.   Yes Historical Provider, MD  albuterol (PROVENTIL HFA;VENTOLIN HFA) 108 (90 BASE) MCG/ACT inhaler Inhale 1-2 puffs into the lungs every 6 (six) hours as needed for wheezing or shortness of breath (cough). Patient not taking: Reported on 06/22/2015 05/19/15   Leta Baptist, MD  dexamethasone (DECADRON) 2 MG tablet Take 5 tablets (10 mg total) by mouth once. Take as a single dose tomorrow morning (Saturday) after breakfast Patient not taking: Reported on 06/22/2015 05/19/15   Leta Baptist, MD  doxycycline (VIBRAMYCIN) 100 MG capsule Take 1 capsule (100 mg total) by mouth 2 (two) times daily. One po bid x 7 days 09/30/15   Joycie Peek, PA-C  HYDROcodone-acetaminophen (HYCET) 7.5-325 mg/15 ml solution Take 10-15 mLs by mouth every 6 (six) hours as needed. 06/24/15   Elvina Sidle, MD  HYDROcodone-acetaminophen (NORCO) 5-325 MG tablet Take 1 tablet by mouth every 6 (six) hours as needed for moderate pain. 06/24/15   Elvina Sidle, MD   BP 159/88 mmHg  Pulse 67  Temp(Src) 98.4 F (36.9 C)  Resp 14  Wt 95.845 kg  SpO2 97% Physical Exam  Constitutional: He is oriented to person, place, and time. He appears well-developed and well-nourished.  HENT:  Head: Normocephalic and atraumatic.  Mouth/Throat: Oropharynx is clear and moist.  Eyes: Conjunctivae are normal. Pupils are equal, round, and reactive to light. Right eye exhibits no discharge. Left eye exhibits no discharge. No scleral icterus.  Neck: Neck supple.  Cardiovascular: Normal rate,  regular rhythm and normal heart sounds.   Pulmonary/Chest: Effort normal and breath sounds normal. No respiratory distress. He has no wheezes. He has no rales.  Abdominal: Soft. There is no tenderness.  Musculoskeletal: He exhibits no tenderness.  Neurological: He is alert and oriented to person, place, and time.  Cranial Nerves II-XII grossly intact  Skin: Skin is warm and dry. No rash noted.  Area of mild  swelling and mild erythema between left thumb and index finger. Small, punctate apparent splinter wound on palmar aspect of hand. Please see picture for further detail. Maintains flexion and extension of all digits and wrist. No forearm pain with movement. Pulses intact with brisk cap refill.  Psychiatric: He has a normal mood and affect.  Nursing note and vitals reviewed.       ED Course  Procedures (including critical care time) Labs Review Labs Reviewed - No data to display  Imaging Review Dg Hand Complete Left  09/30/2015  CLINICAL DATA:  Left hand swelling with pain common no known injury, initial encounter EXAM: LEFT HAND - COMPLETE 3+ VIEW COMPARISON:  None. FINDINGS: Mild curvature to the fifth metacarpal is noted consistent with prior fracture and healing. No acute fracture or dislocation is noted. Generalized soft tissue swelling is seen. IMPRESSION: Soft tissue swelling without acute bony abnormality. Electronically Signed   By: Alcide Clever M.D.   On: 09/30/2015 18:48   I have personally reviewed and evaluated these images and lab results as part of my medical decision-making.   EKG Interpretation None     Meds given in ED:  Medications  oxyCODONE-acetaminophen (PERCOCET/ROXICET) 5-325 MG per tablet 1 tablet (1 tablet Oral Given 09/30/15 1538)  oxyCODONE-acetaminophen (PERCOCET/ROXICET) 5-325 MG per tablet 1 tablet (1 tablet Oral Given 09/30/15 1900)  doxycycline (VIBRA-TABS) tablet 100 mg (not administered)  Tdap (BOOSTRIX) injection 0.5 mL (0.5 mLs Intramuscular Given 09/30/15 1713)    New Prescriptions   DOXYCYCLINE (VIBRAMYCIN) 100 MG CAPSULE    Take 1 capsule (100 mg total) by mouth 2 (two) times daily. One po bid x 7 days   Filed Vitals:   09/30/15 1730 09/30/15 1800 09/30/15 1845 09/30/15 1900  BP: 148/87 148/86 151/87 159/88  Pulse: 79 74 127 67  Temp:      Resp:      Weight:      SpO2: 98% 97% 98% 97%   EMERGENCY DEPARTMENT US SOFT TISSUE  INTERPRETATION "Study: Limited Ultrasound of the noted body part in comments below"  INDICATIONS: Soft tissue infection Multiple views of the body part are obtained with a multi-frequency linear probe  PERFORMED BY:  Myself  IMAGES ARCHIVED?: Yes  SIDE:Left  BODY PART:Upper extremity  FINDINGS: No abcess noted and Cellulitis present  LIMITATIONS:  Body Habitus  INTERPRETATION:  No abcess noted and Cellulitis present  COMMENT:  Small amount of cellulitis noted on the left hand   MDM  Gabriel Potter is a 50 y.o. male with no significant past medical history comes in for evaluation of left hand swelling and erythema. Exam appears to be consistent with a cellulitis. Unclear if small punctate wound over palm is related. Possibly splinter wound? We will obtain x-ray to evaluate for metal shard as patient works as a Curator. Will update tetanus. Plan to initiate outpatient antibiotics. Given doxycycline in emergency department. Patient reports he will be able to follow up with his PCP for wound recheck in 3 days. Also discussed return precautions, he verbalizes understanding and agrees with this plan.  Overall patient appears well, nontoxic, afebrile, hemodynamically stable. Stable for discharge Final diagnoses:  Cellulitis of left upper extremity       Joycie PeekBenjamin Rivka Baune, PA-C 09/30/15 1942  Lyndal Pulleyaniel Knott, MD 10/01/15 [phone]

## 2015-09-30 NOTE — ED Notes (Signed)
Pt stable, ambulatory, states understanding of discharge instructions 

## 2015-09-30 NOTE — Discharge Instructions (Signed)
Your discomfort is likely due to cellulitis, a skin infection. You will be treated for this with antibiotics. Follow-up with your doctor in 3 days for a wound recheck. Return to ED for new or worsening symptoms as we discussed.  Cellulitis Cellulitis is an infection of the skin and the tissue beneath it. The infected area is usually red and tender. Cellulitis occurs most often in the arms and lower legs.  CAUSES  Cellulitis is caused by bacteria that enter the skin through cracks or cuts in the skin. The most common types of bacteria that cause cellulitis are staphylococci and streptococci. SIGNS AND SYMPTOMS   Redness and warmth.  Swelling.  Tenderness or pain.  Fever. DIAGNOSIS  Your health care provider can usually determine what is wrong based on a physical exam. Blood tests may also be done. TREATMENT  Treatment usually involves taking an antibiotic medicine. HOME CARE INSTRUCTIONS   Take your antibiotic medicine as directed by your health care provider. Finish the antibiotic even if you start to feel better.  Keep the infected arm or leg elevated to reduce swelling.  Apply a warm cloth to the affected area up to 4 times per day to relieve pain.  Take medicines only as directed by your health care provider.  Keep all follow-up visits as directed by your health care provider. SEEK MEDICAL CARE IF:   You notice red streaks coming from the infected area.  Your red area gets larger or turns dark in color.  Your bone or joint underneath the infected area becomes painful after the skin has healed.  Your infection returns in the same area or another area.  You notice a swollen bump in the infected area.  You develop new symptoms.  You have a fever. SEEK IMMEDIATE MEDICAL CARE IF:   You feel very sleepy.  You develop vomiting or diarrhea.  You have a general ill feeling (malaise) with muscle aches and pains.   This information is not intended to replace advice given  to you by your health care provider. Make sure you discuss any questions you have with your health care provider.   Document Released: 03/06/2005 Document Revised: 02/15/2015 Document Reviewed: 08/12/2011 Elsevier Interactive Patient Education [employer]2016 Elsevier Inc.

## 2015-10-31 ENCOUNTER — Emergency Department (HOSPITAL_COMMUNITY)
Admission: EM | Admit: 2015-10-31 | Discharge: 2015-10-31 | Disposition: A | Discharge status: Home / Self Care | Payer: 59 | Attending: Emergency Medicine | Admitting: Emergency Medicine

## 2015-10-31 ENCOUNTER — Encounter (HOSPITAL_COMMUNITY): Payer: Self-pay | Admitting: Emergency Medicine

## 2015-10-31 DIAGNOSIS — F17210 Nicotine dependence, cigarettes, uncomplicated: Secondary | ICD-10-CM | POA: Insufficient documentation

## 2015-10-31 DIAGNOSIS — Y9289 Other specified places as the place of occurrence of the external cause: Secondary | ICD-10-CM | POA: Insufficient documentation

## 2015-10-31 DIAGNOSIS — S39012A Strain of muscle, fascia and tendon of lower back, initial encounter: Secondary | ICD-10-CM | POA: Diagnosis not present

## 2015-10-31 DIAGNOSIS — Z7982 Long term (current) use of aspirin: Secondary | ICD-10-CM | POA: Insufficient documentation

## 2015-10-31 DIAGNOSIS — Z79899 Other long term (current) drug therapy: Secondary | ICD-10-CM | POA: Insufficient documentation

## 2015-10-31 DIAGNOSIS — Z8659 Personal history of other mental and behavioral disorders: Secondary | ICD-10-CM | POA: Diagnosis not present

## 2015-10-31 DIAGNOSIS — Y9389 Activity, other specified: Secondary | ICD-10-CM | POA: Insufficient documentation

## 2015-10-31 DIAGNOSIS — X500XXA Overexertion from strenuous movement or load, initial encounter: Secondary | ICD-10-CM | POA: Insufficient documentation

## 2015-10-31 DIAGNOSIS — Y990 Civilian activity done for income or pay: Secondary | ICD-10-CM | POA: Diagnosis not present

## 2015-10-31 DIAGNOSIS — I10 Essential (primary) hypertension: Secondary | ICD-10-CM | POA: Insufficient documentation

## 2015-10-31 DIAGNOSIS — I252 Old myocardial infarction: Secondary | ICD-10-CM | POA: Diagnosis not present

## 2015-10-31 DIAGNOSIS — S3992XA Unspecified injury of lower back, initial encounter: Secondary | ICD-10-CM | POA: Diagnosis present

## 2015-10-31 DIAGNOSIS — M545 Low back pain, unspecified: Secondary | ICD-10-CM

## 2015-10-31 MED ORDER — OXYCODONE-ACETAMINOPHEN 5-325 MG PO TABS: 1.0000 | ORAL_TABLET | ORAL | Status: DC | PRN

## 2015-10-31 MED ORDER — OXYCODONE-ACETAMINOPHEN 5-325 MG PO TABS
2.0000 | ORAL_TABLET | Freq: Once | ORAL | Status: AC
  Administered 2015-10-31: 2 via ORAL
  Filled 2015-10-31: qty 2

## 2015-10-31 MED ORDER — LIDOCAINE 5 % EX PTCH: 1.0000 | MEDICATED_PATCH | CUTANEOUS | Status: DC

## 2015-10-31 MED ORDER — LIDOCAINE 5 % EX PTCH
1.0000 | MEDICATED_PATCH | CUTANEOUS | Status: DC
  Administered 2015-10-31: 1 via TRANSDERMAL
  Filled 2015-10-31: qty 1

## 2015-10-31 MED ORDER — KETOROLAC TROMETHAMINE 60 MG/2ML IM SOLN
60.0000 mg | Freq: Once | INTRAMUSCULAR | Status: AC
  Administered 2015-10-31: 60 mg via INTRAMUSCULAR
  Filled 2015-10-31: qty 2

## 2015-10-31 MED ORDER — NAPROXEN 500 MG PO TABS: 500.0000 mg | ORAL_TABLET | Freq: Two times a day (BID) | ORAL | Status: DC

## 2015-10-31 MED ORDER — METHOCARBAMOL 500 MG PO TABS: 500.0000 mg | ORAL_TABLET | Freq: Two times a day (BID) | ORAL | Status: DC

## 2015-10-31 MED ORDER — METHOCARBAMOL 500 MG PO TABS
1000.0000 mg | ORAL_TABLET | Freq: Once | ORAL | Status: AC
  Administered 2015-10-31: 1000 mg via ORAL
  Filled 2015-10-31: qty 2

## 2015-10-31 NOTE — ED Provider Notes (Signed)
CSN: [id number]     Arrival date & time 10/31/15  1046 History  By signing my name below, I, Octavia Heir, attest that this documentation has been prepared under the direction and in the presence of Westlyn Glaza, PA-C. Electronically Signed: Octavia Heir, ED Scribe. 10/31/2015. 12:39 PM.    Chief Complaint  Patient presents with  . Back Pain      The history is provided by the patient. No language interpreter was used.   HPI Comments: Gabriel Potter is a 50 y.o. male who has a PMHx of MI and HTN presents to the Emergency Department complaining of sudden onset, gradual worsening, sharp lower left back pain onset yesterday evening. Pt says he was at work lifting something very heavy (about 400 lbs) and believes he "pulled something" in his back. He says he has a hx of back pain in the same area but he notes that his current pain is worse than ever before. Pt is able to minimally ambulate secondary to pain. He reports taking vicodin to alleviate his pain with no relief. He denies any fall, urinary or bowel problems, neuro deficits, or any other complaints. Pt has no known drug allergies. Denies hx of back surgery or hx of kidney problems.  Past Medical History  Diagnosis Date  . Bipolar 1 disorder (HCC)   . Chest pain     [facility]. 05/23/14. No decreased activity in the left ventricle on stress imaging to suggest reversible ischemia or infarction. Left Ventricular Ejection Fraction: 52%  . MI (myocardial infarction) (HCC)   . Hypertension    History reviewed. No pertinent past surgical history. No family history on file. Social History  Substance Use Topics  . Smoking status: Current Every Day Smoker -- 2.00 packs/day    Types: Cigarettes  . Smokeless tobacco: None  . Alcohol Use: No    Review of Systems  Gastrointestinal: Negative for nausea and vomiting.  Genitourinary: Negative for difficulty urinating.  Musculoskeletal: Positive for back pain.  Neurological: Negative  for dizziness, weakness, light-headedness, numbness and headaches.      Allergies  Review of patient's allergies indicates no known allergies.  Home Medications   Prior to Admission medications   Medication Sig Start Date End Date Taking? Authorizing Provider  HYDROcodone-acetaminophen (NORCO) 5-325 MG tablet Take 1 tablet by mouth every 6 (six) hours as needed for moderate pain. 06/24/15  Yes Elvina Sidle, MD  albuterol (PROVENTIL HFA;VENTOLIN HFA) 108 (90 BASE) MCG/ACT inhaler Inhale 1-2 puffs into the lungs every 6 (six) hours as needed for wheezing or shortness of breath (cough). Patient not taking: Reported on 06/22/2015 05/19/15   Leta Baptist, MD  aspirin 81 MG tablet Take 81 mg by mouth daily.    Historical Provider, MD  dexamethasone (DECADRON) 2 MG tablet Take 5 tablets (10 mg total) by mouth once. Take as a single dose tomorrow morning (Saturday) after breakfast Patient not taking: Reported on 06/22/2015 05/19/15   Leta Baptist, MD  doxycycline (VIBRAMYCIN) 100 MG capsule Take 1 capsule (100 mg total) by mouth 2 (two) times daily. One po bid x 7 days 09/30/15   Joycie Peek, PA-C  HYDROcodone-acetaminophen (HYCET) 7.5-325 mg/15 ml solution Take 10-15 mLs by mouth every 6 (six) hours as needed. 06/24/15   Elvina Sidle, MD  lidocaine (LIDODERM) 5 % Place 1 patch onto the skin daily. Remove & Discard patch within 12 hours or as directed by MD 10/31/15   Gabriel Pancoast, PA-C  methocarbamol (  ROBAXIN) 500 MG tablet Take 1 tablet (500 mg total) by mouth 2 (two) times daily. 10/31/15   Gabriel Shouse C Ryden Wainer, PA-C  naproxen (NAPROSYN) 500 MG tablet Take 1 tablet (500 mg total) by mouth 2 (two) times daily. 10/31/15   Gabriel Geiler C Melaney Tellefsen, PA-C  oxyCODONE-acetaminophen (PERCOCET/ROXICET) 5-325 MG tablet Take 1-2 tablets by mouth every 4 (four) hours as needed for severe pain. 10/31/15   Gabriel PancoastShawn C Lillyona Polasek, PA-C   Triage vitals: BP 138/80 mmHg  Pulse 78  Temp(Src) 98.2 F (36.8 C) (Oral)  Resp 18  Ht 5\' 10"   (1.778 m)  Wt 200 lb (90.719 kg)  BMI 28.70 kg/m2  SpO2 95% Physical Exam  Constitutional: He appears well-developed and well-nourished. No distress.  HENT:  Head: Normocephalic and atraumatic.  Eyes: Conjunctivae are normal.  Neck: Neck supple.  Cardiovascular: Normal rate and regular rhythm.   Pulmonary/Chest: Effort normal.  Musculoskeletal: He exhibits tenderness.  TTP to the left lumbar sacral musculature with midline tenderness to lumbar and sacrum, hesitant limping gait, can support weight on the left  Neurological: He is alert.  No sensory deficits, strength is 5/5 on left but weaker than the right.   Skin: Skin is warm and dry. He is not diaphoretic.  Nursing note and vitals reviewed.     ED Course  Procedures  DIAGNOSTIC STUDIES: Oxygen Saturation is 95% on RA, adequate by my interpretation.  COORDINATION OF CARE:  12:38 PM Discussed treatment plan which includes pain medication with pt at bedside and pt agreed to plan. Pt was advised to follow up with an orthopedic specialist for his back.    MDM   Final diagnoses:  Left-sided low back pain without sciatica  Lumbar strain, initial encounter    Gabriel Potter presents with left lower back pain that began last night.  Patient's story and physical exam suggest muscle strain. No neuro deficits. Difference of strengths on initial exam seems to been due to pain, as it corrected completely after pain was reduced. Patient pain-free upon discharge. Home care and return precautions discussed. Patient to follow up with PCP should symptoms continue. Patient voiced understanding of these instructions and is comfortable with discharge.  Filed Vitals:   10/31/15 1106 10/31/15 1416  BP: 138/80 135/77  Pulse: 78 74  Temp: 98.2 F (36.8 C) 98 F (36.7 C)  TempSrc: Oral Oral  Resp: 18 18  Height: 5\' 10"  (1.778 m)   Weight: 90.719 kg   SpO2: 95% 98%    I personally performed the services described in this  documentation, which was scribed in my presence. The recorded information has been reviewed and is accurate.    Gabriel PancoastShawn C Pershing Skidmore, PA-C 10/31/15 2309  Lorre NickAnthony Allen, MD 11/03/15 [phone]

## 2015-10-31 NOTE — Discharge Instructions (Signed)
You have been seen today for back pain. Her presentation and physical exam findings are consistent with a muscle strain. It is recommended that you do not engage in strenuous activity release for the next 24-48 hours. Do not, however, later down for long periods of time without moving as this will make the muscle stiff. Take 500 mg of naproxen every 12 hours or 800 mg of ibuprofen every 8 hours for the next 3 days. Robaxin as a muscle relaxer may help loosen tight muscles. Percocet as needed for severe pain. Do not use the Percocet or Robaxin while driving or performing other dangerous activities. Follow up with PCP as needed for reevaluation and chronic management. Return to ED should symptoms worsen.

## 2015-10-31 NOTE — ED Notes (Signed)
Patient states was lifting something heavy yesterday and thinks he pulled something in his back.  Patient denies incontinence or urinary symptoms.   Patient complains of left lower back pain.   Patient states took vicodin at home for pain last night, he took Doans back ache pills for pain today.  Patient states little relief.

## 2015-12-04 ENCOUNTER — Emergency Department (HOSPITAL_COMMUNITY)
Admission: EM | Admit: 2015-12-04 | Discharge: 2015-12-04 | Disposition: A | Discharge status: Home / Self Care | Payer: 59 | Attending: Emergency Medicine | Admitting: Emergency Medicine

## 2015-12-04 ENCOUNTER — Encounter (HOSPITAL_COMMUNITY): Payer: Self-pay | Admitting: Emergency Medicine

## 2015-12-04 DIAGNOSIS — Z7982 Long term (current) use of aspirin: Secondary | ICD-10-CM | POA: Insufficient documentation

## 2015-12-04 DIAGNOSIS — L989 Disorder of the skin and subcutaneous tissue, unspecified: Secondary | ICD-10-CM | POA: Diagnosis present

## 2015-12-04 DIAGNOSIS — F17210 Nicotine dependence, cigarettes, uncomplicated: Secondary | ICD-10-CM | POA: Insufficient documentation

## 2015-12-04 DIAGNOSIS — F319 Bipolar disorder, unspecified: Secondary | ICD-10-CM | POA: Diagnosis not present

## 2015-12-04 DIAGNOSIS — Z79899 Other long term (current) drug therapy: Secondary | ICD-10-CM | POA: Insufficient documentation

## 2015-12-04 DIAGNOSIS — I10 Essential (primary) hypertension: Secondary | ICD-10-CM | POA: Diagnosis not present

## 2015-12-04 DIAGNOSIS — I252 Old myocardial infarction: Secondary | ICD-10-CM | POA: Diagnosis not present

## 2015-12-04 MED ORDER — BACITRACIN ZINC 500 UNIT/GM EX OINT: 1.0000 "application " | TOPICAL_OINTMENT | Freq: Two times a day (BID) | CUTANEOUS | Status: DC

## 2015-12-04 MED ORDER — LIDOCAINE HCL 2 % IJ SOLN
5.0000 mL | Freq: Once | INTRAMUSCULAR | Status: AC
  Administered 2015-12-04: 100 mg
  Filled 2015-12-04: qty 20

## 2015-12-04 NOTE — ED Notes (Signed)
ED PA at bedside

## 2015-12-04 NOTE — Discharge Instructions (Signed)
Bacitracin topically twice a day. Keep clean. Wash with soap and water. Follow up with dermatologist of your choice.

## 2015-12-04 NOTE — ED Notes (Signed)
Pt c/o new onset pain and change to existing painless colorless "skin tag" under left eye along with new facial burning sensation around lesion. Currently raised, red, round, dark brown/black, which is all new. Pustule present immediately on medial border, pt states this is new.

## 2015-12-04 NOTE — ED Notes (Signed)
AWARE WOULD DISCHARGE SHORTLY

## 2015-12-04 NOTE — ED Provider Notes (Signed)
CSN: [id number]     Arrival date & time 12/04/15  1035 History   First MD Initiated Contact with Patient 12/04/15 1142     Chief Complaint  Patient presents with  . Skin Problem     (Consider location/radiation/quality/duration/timing/severity/associated sxs/prior Treatment) HPI Gabriel Potter is a 50 y.o. male presents to emergency department complaining of skin lesion to the left face. Patient states he had a skin colored skin tag to the left face for "years and years." He states approximately 2 weeks ago he had severe cough, states he developed petechiae to the face from coughing. Since that time, petechiae resolved, however the skin tag became black in color and he believes it is larger than it was before. Patient states she is concerned it could be cancerous. He reports just slight tenderness to the area. He denies any drainage. He denies any fever or chills. He denies any eye complaints. No treatment prior to coming in. Patient states he currently does not have a primary care doctor.  Past Medical History  Diagnosis Date  . Bipolar 1 disorder (HCC)   . Chest pain     [facility] 05/23/14. No decreased activity in the left ventricle on stress imaging to suggest reversible ischemia or infarction. Left Ventricular Ejection Fraction: 52%  . MI (myocardial infarction) (HCC)   . Hypertension    History reviewed. No pertinent past surgical history. History reviewed. No pertinent family history. Social History  Substance Use Topics  . Smoking status: Current Every Day Smoker -- 2.00 packs/day    Types: Cigarettes  . Smokeless tobacco: None  . Alcohol Use: No    Review of Systems  Constitutional: Negative for fever and chills.  Eyes: Negative for pain and visual disturbance.  Skin: Positive for color change and wound.  Neurological: Negative for headaches.  All other systems reviewed and are negative.     Allergies  Review of patient's allergies indicates no known  allergies.  Home Medications   Prior to Admission medications   Medication Sig Start Date End Date Taking? Authorizing Provider  albuterol (PROVENTIL HFA;VENTOLIN HFA) 108 (90 BASE) MCG/ACT inhaler Inhale 1-2 puffs into the lungs every 6 (six) hours as needed for wheezing or shortness of breath (cough). 05/19/15  Yes Leta BaptistEmily Roe Nguyen, MD  aspirin 81 MG tablet Take 81 mg by mouth daily.   Yes Historical Provider, MD  lidocaine (LIDODERM) 5 % Place 1 patch onto the skin daily. Remove & Discard patch within 12 hours or as directed by MD 10/31/15  Yes Shawn C Joy, PA-C  methocarbamol (ROBAXIN) 500 MG tablet Take 1 tablet (500 mg total) by mouth 2 (two) times daily. 10/31/15  Yes Shawn C Joy, PA-C  naproxen (NAPROSYN) 500 MG tablet Take 1 tablet (500 mg total) by mouth 2 (two) times daily. 10/31/15  Yes Shawn C Joy, PA-C  oxyCODONE-acetaminophen (PERCOCET/ROXICET) 5-325 MG tablet Take 1-2 tablets by mouth every 4 (four) hours as needed for severe pain. 10/31/15  Yes Shawn C Joy, PA-C  doxycycline (VIBRAMYCIN) 100 MG capsule Take 1 capsule (100 mg total) by mouth 2 (two) times daily. One po bid x 7 days Patient not taking: Reported on 12/04/2015 09/30/15   Joycie PeekBenjamin Cartner, PA-C  HYDROcodone-acetaminophen (HYCET) 7.5-325 mg/15 ml solution Take 10-15 mLs by mouth every 6 (six) hours as needed. Patient not taking: Reported on 12/04/2015 06/24/15   Elvina SidleKurt Lauenstein, MD  HYDROcodone-acetaminophen [facility](NORCO) 5-325 MG tablet Take 1 tablet by mouth every 6 (six) hours as  needed for moderate pain. Patient not taking: Reported on 12/04/2015 06/24/15   Elvina SidleKurt Lauenstein, MD   BP 152/96 mmHg  Pulse 67  Temp(Src) 99 F (37.2 C) (Oral)  Resp 16  Ht 5\' 10"  (1.778 m)  Wt 89.812 kg  BMI 28.41 kg/m2  SpO2 99% Physical Exam  Constitutional: He is oriented to person, place, and time. He appears well-developed and well-nourished. No distress.  HENT:  Head:    Eyes: Conjunctivae are normal.  Neck: Neck supple.    Neurological: He is alert and oriented to person, place, and time.  Skin: Skin is warm and dry.  Flesh colored skin tag to the left face, with one part of the tag enlarged with bluish-black in color. No ttp. No surrounding erythema or tenderness.   Nursing note and vitals reviewed.   ED Course  Procedures (including critical care time) Labs Review Labs Reviewed - No data to display  Imaging Review No results found. I have personally reviewed and evaluated these images and lab results as part of my medical decision-making.   EKG Interpretation None      INCISION AND DRAINAGE Performed by: Jaynie CrumbleKIRICHENKO, Nahun Kronberg A Consent: Verbal consent obtained. Risks and benefits: risks, benefits and alternatives were discussed Type: hematoma  Body area: left face  Anesthesia: local infiltration  Incision was made with a scalpel.  Local anesthetic: lidocaine 2% wo epinephrine  Anesthetic total: 1 ml  Drainage: bloody   Patient tolerance: Patient tolerated the procedure well with no immediate complications.     MDM   Final diagnoses:  Facial lesion   Patient with a skin lesion to the left base which now become black in color. I suspect the lack this is due to a hematoma under the skin. I discussed with Dr.Nanavati, who has seen him as well. Pt requested to try to open the wound. We will do I D here to release the blood. Patient still encouraged to follow-up with dermatology for further evaluation and biopsy of the lesion. Question possible basal cell carcinoma lesion versus a skin mole.   After I&D, blood and small clots removed. Lesion back to skin colored. Will need derm follow up.   Filed Vitals:   12/04/15 1047 12/04/15 1206 12/04/15 1209  BP: 168/106  152/96  Pulse: 92  67  Temp: 99 F (37.2 C)    TempSrc: Oral    Resp: 16  16  Height:  5\' 10"  (1.778 m)   Weight:  89.812 kg   SpO2: 99%  99%       Jaynie Crumbleatyana Denelle Capurro, PA-C 12/04/15 2057  Derwood KaplanAnkit Nanavati,  MD 12/05/15 [id number]

## 2015-12-05 ENCOUNTER — Emergency Department (HOSPITAL_COMMUNITY): Payer: 59

## 2015-12-05 ENCOUNTER — Observation Stay (HOSPITAL_COMMUNITY)
Admission: EM | Admit: 2015-12-05 | Discharge: 2015-12-05 | Disposition: A | Discharge status: Home / Self Care | Payer: 59 | Attending: Family Medicine | Admitting: Family Medicine

## 2015-12-05 ENCOUNTER — Other Ambulatory Visit: Payer: Self-pay

## 2015-12-05 ENCOUNTER — Encounter (HOSPITAL_COMMUNITY): Payer: Self-pay | Admitting: Emergency Medicine

## 2015-12-05 DIAGNOSIS — Z7982 Long term (current) use of aspirin: Secondary | ICD-10-CM | POA: Diagnosis not present

## 2015-12-05 DIAGNOSIS — E669 Obesity, unspecified: Secondary | ICD-10-CM | POA: Diagnosis not present

## 2015-12-05 DIAGNOSIS — Z8249 Family history of ischemic heart disease and other diseases of the circulatory system: Secondary | ICD-10-CM | POA: Diagnosis not present

## 2015-12-05 DIAGNOSIS — R079 Chest pain, unspecified: Principal | ICD-10-CM | POA: Diagnosis present

## 2015-12-05 DIAGNOSIS — F17210 Nicotine dependence, cigarettes, uncomplicated: Secondary | ICD-10-CM | POA: Diagnosis not present

## 2015-12-05 DIAGNOSIS — I10 Essential (primary) hypertension: Secondary | ICD-10-CM | POA: Diagnosis not present

## 2015-12-05 DIAGNOSIS — R0789 Other chest pain: Secondary | ICD-10-CM

## 2015-12-05 DIAGNOSIS — I159 Secondary hypertension, unspecified: Secondary | ICD-10-CM

## 2015-12-05 LAB — CBC
HEMATOCRIT: 43.2 % (ref 39.0–52.0)
Hemoglobin: 14.9 g/dL (ref 13.0–17.0)
MCH: 29.9 pg (ref 26.0–34.0)
MCHC: 34.5 g/dL (ref 30.0–36.0)
MCV: 86.7 fL (ref 78.0–100.0)
Platelets: 185 10*3/uL (ref 150–400)
RBC: 4.98 MIL/uL (ref 4.22–5.81)
RDW: 13.1 % (ref 11.5–15.5)
WBC: 7.2 10*3/uL (ref 4.0–10.5)

## 2015-12-05 LAB — BASIC METABOLIC PANEL
Anion gap: 8 (ref 5–15)
BUN: 15 mg/dL (ref 6–20)
CALCIUM: 9.4 mg/dL (ref 8.9–10.3)
CO2: 24 mmol/L (ref 22–32)
Chloride: 107 mmol/L (ref 101–111)
Creatinine, Ser: 0.76 mg/dL (ref 0.61–1.24)
GFR calc Af Amer: >60 mL/min (ref >60)
GLUCOSE: 111 mg/dL — AB (ref 65–99)
Potassium: 3.5 mmol/L (ref 3.5–5.1)
Sodium: 139 mmol/L (ref 135–145)

## 2015-12-05 LAB — I-STAT TROPONIN, ED
TROPONIN I, POC: 0 ng/mL (ref 0.00–0.08)
TROPONIN I, POC: 0 ng/mL (ref 0.00–0.08)

## 2015-12-05 MED ORDER — ASPIRIN 81 MG PO CHEW
162.0000 mg | CHEWABLE_TABLET | Freq: Once | ORAL | Status: AC
  Administered 2015-12-05: 162 mg via ORAL
  Filled 2015-12-05: qty 2

## 2015-12-05 MED ORDER — HYDROCHLOROTHIAZIDE 25 MG PO TABS: 25.0000 mg | ORAL_TABLET | Freq: Every day | ORAL | Status: DC

## 2015-12-05 NOTE — ED Notes (Signed)
Hospitalist at bedside. Per hospitalist, hold transportation to floor at this time.

## 2015-12-05 NOTE — ED Notes (Signed)
Tech drawing labs 

## 2015-12-05 NOTE — ED Provider Notes (Signed)
CSN: [id number]     Arrival date & time 12/05/15  1016 History   First MD Initiated Contact with Patient 12/05/15 1107     Chief Complaint  Patient presents with  . Chest Pain     (Consider location/radiation/quality/duration/timing/severity/associated sxs/prior Treatment) HPI Comments: Pt reports that he had a heart attack in 2015 due to energy drinks. He was at [place]. Pt reports normal stress test thereafter. He has had no catheterization or stent placement.   ROS 10 Systems reviewed and are negative for acute change except as noted in the HPI.     Patient is a 50 y.o. male presenting with chest pain. The history is provided by the patient.  Chest Pain Pain location:  L chest Pain quality: sharp   Pain radiates to:  Neck and L shoulder Pain radiates to the back: no   Pain severity:  No pain Duration:  2 hours Timing:  Constant Progression:  Resolved Chronicity:  New Ineffective treatments:  Aspirin Associated symptoms: dizziness and nausea   Risk factors: high cholesterol, male sex, obesity and smoking     Past Medical History  Diagnosis Date  . Bipolar 1 disorder (HCC)   . Chest pain     [facility] 05/23/14. No decreased activity in the left ventricle on stress imaging to suggest reversible ischemia or infarction. Left Ventricular Ejection Fraction: 52%  . MI (myocardial infarction) (HCC)   . Hypertension    History reviewed. No pertinent past surgical history. No family history on file. Social History  Substance Use Topics  . Smoking status: Current Every Day Smoker -- 2.00 packs/day    Types: Cigarettes  . Smokeless tobacco: None  . Alcohol Use: No    Review of Systems  Cardiovascular: Positive for chest pain.  Gastrointestinal: Positive for nausea.  Neurological: Positive for dizziness.      Allergies  Review of patient's allergies indicates no known allergies.  Home Medications   Prior to Admission medications   Medication Sig  Start Date End Date Taking? Authorizing Provider  Acetaminophen (PAIN RELIEVER NO ASA EX ST PO) Take 2 tablets by mouth once.    Yes Historical Provider, MD  albuterol (PROVENTIL HFA;VENTOLIN HFA) 108 (90 BASE) MCG/ACT inhaler Inhale 1-2 puffs into the lungs every 6 (six) hours as needed for wheezing or shortness of breath (cough). 05/19/15  Yes Leta BaptistEmily Roe Nguyen, MD  aspirin 81 MG tablet Take 81 mg by mouth daily.   Yes Historical Provider, MD  bacitracin ointment Apply 1 application topically 2 (two) times daily. 12/04/15  Yes Tatyana Kirichenko, PA-C  lidocaine (LIDODERM) 5 % Place 1 patch onto the skin daily. Remove & Discard patch within 12 hours or as directed by MD Patient taking differently: Place 1 patch onto the skin daily as needed (for pain). Remove & Discard patch within 12 hours or as directed by MD 10/31/15  Yes Shawn C Joy, PA-C  methocarbamol (ROBAXIN) 500 MG tablet Take 1 tablet (500 mg total) by mouth 2 (two) times daily. Patient taking differently: Take 500 mg by mouth 2 (two) times daily as needed for muscle spasms.  10/31/15  Yes Shawn C Joy, PA-C  naproxen (NAPROSYN) 500 MG tablet Take 1 tablet (500 mg total) by mouth 2 (two) times daily. Patient taking differently: Take 500 mg by mouth 2 (two) times daily as needed for mild pain.  10/31/15  Yes Shawn C Joy, PA-C  oxyCODONE-acetaminophen (PERCOCET/ROXICET) 5-325 MG tablet Take 1-2 tablets by mouth every 4 (  four) hours as needed for severe pain. 10/31/15  Yes Shawn C Joy, PA-C  doxycycline (VIBRAMYCIN) 100 MG capsule Take 1 capsule (100 mg total) by mouth 2 (two) times daily. One po bid x 7 days Patient not taking: Reported on 12/04/2015 09/30/15   Joycie PeekBenjamin Cartner, PA-C  HYDROcodone-acetaminophen (HYCET) 7.5-325 mg/15 ml solution Take 10-15 mLs by mouth every 6 (six) hours as needed. Patient not taking: Reported on 12/04/2015 06/24/15   Elvina SidleKurt Lauenstein, MD  HYDROcodone-acetaminophen [facility](NORCO) 5-325 MG tablet Take 1 tablet by mouth every 6  (six) hours as needed for moderate pain. Patient not taking: Reported on 12/04/2015 06/24/15   Elvina SidleKurt Lauenstein, MD   BP 151/88 mmHg  Pulse 59  Temp(Src) 98.2 F (36.8 C) (Oral)  Resp 19  SpO2 97% Physical Exam  Constitutional: He is oriented to person, place, and time. He appears well-developed.  HENT:  Head: Atraumatic.  Neck: Neck supple. No JVD present.  Cardiovascular: Normal rate and intact distal pulses.   Pulmonary/Chest: Effort normal.  Neurological: He is alert and oriented to person, place, and time.  Skin: Skin is warm.  Nursing note and vitals reviewed.   ED Course  Procedures (including critical care time) Labs Review Labs Reviewed  BASIC METABOLIC PANEL - Abnormal; Notable for the following:    Glucose, Bld 111 (*)    All other components within normal limits  CBC  I-STAT TROPOININ, ED    Imaging Review Dg Chest 2 View  12/05/2015  CLINICAL DATA:  Mid chest pain.  Lightheaded.  Shortness of breath. EXAM: CHEST  2 VIEW COMPARISON:  None. FINDINGS: The heart size and mediastinal contours are within normal limits. Both lungs are clear. The visualized skeletal structures are unremarkable. IMPRESSION: Negative two view chest x-ray Electronically Signed   By: Marin Robertshristopher  Mattern M.D.   On: 12/05/2015 10:59   I have personally reviewed and evaluated these images and lab results as part of my medical decision-making.   EKG Interpretation   Date/Time:  Tuesday December 05 2015 10:24:43 EDT Ventricular Rate:  76 PR Interval:    QRS Duration: 88 QT Interval:  387 QTC Calculation: 436 R Axis:   33 Text Interpretation:  Sinus rhythm normal axis and intervals No acute  changes No significant change since last tracing Confirmed by Orie Baxendale,  MD, Danajah Birdsell [phone]) on 12/05/2015 1:36:22 PM      MDM   Final diagnoses:  Chest pain radiating to arm   Pt comes in with cc of chest pain. HEAR score is 4 (1 for hx and age, 2 for risk factor). Pt is chest pain free, with a  normal ekg and trop x 1      Derwood KaplanAnkit Nakeita Styles, MD 12/05/15 1337

## 2015-12-05 NOTE — Progress Notes (Addendum)
Pt states he last saw Dr Milus GlazierLauenstein when he was at [place] [facility] Pt states Dr Milus GlazierLauenstein has retired  Pt willing to get another [facility] ED CM noted pt with coverage but no pcp listed Spoke with pt who confirms no pcp [facility] ED CM spoke with pt on how to obtain an in network pcp with insurance coverage via the customer service number or web site  Cm reviewed ED level of care for crisis/emergent services and community pcp level of care to manage continuous or chronic medical concerns.  The pt voiced understanding CM encouraged pt and discussed pt's responsibility to verify with pt's insurance carrier that any recommended medical provider offered by any emergency room or a hospital provider is within the carrier's network. The pt voiced understanding  Pt given a 10 page list of internal medicine providers for [facility] who are accepting new pts within zip code [phone] Encouraged pt to review and find a pcp for f/u care  Pt with [facility] 5 ED visits in the last 6 months

## 2015-12-05 NOTE — Discharge Instructions (Signed)
We saw you in the ER for the chest pain/shortness of breath. All of our cardiac workup is normal, including labs, EKG and chest X-RAY are normal. We are not sure what is causing your discomfort, but we feel comfortable sending you home at this time. The workup in the ER is not complete, and you should follow up with the Cardiology team.  Please return to the ER if you have worsening chest pain, shortness of breath, pain radiating to your jaw, shoulder, or back, sweats or fainting. Otherwise see the Cardiologist or your primary care doctor as requested.   Hypertension Hypertension, commonly called high blood pressure, is when the force of blood pumping through your arteries is too strong. Your arteries are the blood vessels that carry blood from your heart throughout your body. A blood pressure reading consists of a higher number over a lower number, such as 110/72. The higher number (systolic) is the pressure inside your arteries when your heart pumps. The lower number (diastolic) is the pressure inside your arteries when your heart relaxes. Ideally you want your blood pressure below 120/80. Hypertension forces your heart to work harder to pump blood. Your arteries may become narrow or stiff. Having untreated or uncontrolled hypertension can cause heart attack, stroke, kidney disease, and other problems. RISK FACTORS Some risk factors for high blood pressure are controllable. Others are not.  Risk factors you cannot control include:   Race. You may be at higher risk if you are African American.  Age. Risk increases with age.  Gender. Men are at higher risk than women before age 46 years. After age 12, women are at higher risk than men. Risk factors you can control include:  Not getting enough exercise or physical activity.  Being overweight.  Getting too much fat, sugar, calories, or salt in your diet.  Drinking too much alcohol. SIGNS AND SYMPTOMS Hypertension does not usually cause  signs or symptoms. Extremely high blood pressure (hypertensive crisis) may cause headache, anxiety, shortness of breath, and nosebleed. DIAGNOSIS To check if you have hypertension, your health care provider will measure your blood pressure while you are seated, with your arm held at the level of your heart. It should be measured at least twice using the same arm. Certain conditions can cause a difference in blood pressure between your right and left arms. A blood pressure reading that is higher than normal on one occasion does not mean that you need treatment. If it is not clear whether you have high blood pressure, you may be asked to return on a different day to have your blood pressure checked again. Or, you may be asked to monitor your blood pressure at home for 1 or more weeks. TREATMENT Treating high blood pressure includes making lifestyle changes and possibly taking medicine. Living a healthy lifestyle can help lower high blood pressure. You may need to change some of your habits. Lifestyle changes may include:  Following the DASH diet. This diet is high in fruits, vegetables, and whole grains. It is low in salt, red meat, and added sugars.  Keep your sodium intake below 2,300 mg per day.  Getting at least 30-45 minutes of aerobic exercise at least 4 times per week.  Losing weight if necessary.  Not smoking.  Limiting alcoholic beverages.  Learning ways to reduce stress. Your health care provider may prescribe medicine if lifestyle changes are not enough to get your blood pressure under control, and if one of the following is true:  You  are 6518-52 years of age and your systolic blood pressure is above 140.  You are 52 years of age or older, and your systolic blood pressure is above 150.  Your diastolic blood pressure is above 90.  You have diabetes, and your systolic blood pressure is over 140 or your diastolic blood pressure is over 90.  You have kidney disease and your blood  pressure is above 140/90.  You have heart disease and your blood pressure is above 140/90. Your personal target blood pressure may vary depending on your medical conditions, your age, and other factors. HOME CARE INSTRUCTIONS  Have your blood pressure rechecked as directed by your health care provider.   Take medicines only as directed by your health care provider. Follow the directions carefully. Blood pressure medicines must be taken as prescribed. The medicine does not work as well when you skip doses. Skipping doses also puts you at risk for problems.  Do not smoke.   Monitor your blood pressure at home as directed by your health care provider. SEEK MEDICAL CARE IF:   You think you are having a reaction to medicines taken.  You have recurrent headaches or feel dizzy.  You have swelling in your ankles.  You have trouble with your vision. SEEK IMMEDIATE MEDICAL CARE IF:  You develop a severe headache or confusion.  You have unusual weakness, numbness, or feel faint.  You have severe chest or abdominal pain.  You vomit repeatedly.  You have trouble breathing. MAKE SURE YOU:   Understand these instructions.  Will watch your condition.  Will get help right away if you are not doing well or get worse.   This information is not intended to replace advice given to you by your health care provider. Make sure you discuss any questions you have with your health care provider.   Document Released: 05/27/2005 Document Revised: 10/11/2014 Document Reviewed: 03/19/2013 Elsevier Interactive Patient Education 2016 Else[employer] Inc[employer] Nonspecific Chest Pain  Chest pain can be caused by many different conditions. There is always a chance that your pain could be related to something serious, such as a heart attack or a blood clot in your lungs. Chest pain can also be caused by conditions that are not life-threatening. If you have chest pain, it is very important to follow up with your  health care provider. CAUSES  Chest pain can be caused by:  Heartburn.  Pneumonia or bronchitis.  Anxiety or stress.  Inflammation around your heart (pericarditis) or lung (pleuritis or pleurisy).  A blood clot in your lung.  A collapsed lung (pneumothorax). It can develop suddenly on its own (spontaneous pneumothorax) or from trauma to the chest.  Shingles infection (varicella-zoster virus).  Heart attack.  Damage to the bones, muscles, and cartilage that make up your chest wall. This can include:  Bruised bones due to injury.  Strained muscles or cartilage due to frequent or repeated coughing or overwork.  Fracture to one or more ribs.  Sore cartilage due to inflammation (costochondritis). RISK FACTORS  Risk factors for chest pain may include:  Activities that increase your risk for trauma or injury to your chest.  Respiratory infections or conditions that cause frequent coughing.  Medical conditions or overeating that can cause heartburn.  Heart disease or family history of heart disease.  Conditions or health behaviors that increase your risk of developing a blood clot.  Having had chicken pox (varicella zoster). SIGNS AND SYMPTOMS Chest pain can feel like:  Burning or tingling on  the surface of your chest or deep in your chest.  Crushing, pressure, aching, or squeezing pain.  Dull or sharp pain that is worse when you move, cough, or take a deep breath.  Pain that is also felt in your back, neck, shoulder, or arm, or pain that spreads to any of these areas. Your chest pain may come and go, or it may stay constant. DIAGNOSIS Lab tests or other studies may be needed to find the cause of your pain. Your health care provider may have you take a test called an ambulatory ECG (electrocardiogram). An ECG records your heartbeat patterns at the time the test is performed. You may also have other tests, such as:  Transthoracic echocardiogram (TTE). During  echocardiography, sound waves are used to create a picture of all of the heart structures and to look at how blood flows through your heart.  Transesophageal echocardiogram (TEE).This is a more advanced imaging test that obtains images from inside your body. It allows your health care provider to see your heart in finer detail.  Cardiac monitoring. This allows your health care provider to monitor your heart rate and rhythm in real time.  Holter monitor. This is a portable device that records your heartbeat and can help to diagnose abnormal heartbeats. It allows your health care provider to track your heart activity for several days, if needed.  Stress tests. These can be done through exercise or by taking medicine that makes your heart beat more quickly.  Blood tests.  Imaging tests. TREATMENT  Your treatment depends on what is causing your chest pain. Treatment may include:  Medicines. These may include:  Acid blockers for heartburn.  Anti-inflammatory medicine.  Pain medicine for inflammatory conditions.  Antibiotic medicine, if an infection is present.  Medicines to dissolve blood clots.  Medicines to treat coronary artery disease.  Supportive care for conditions that do not require medicines. This may include:  Resting.  Applying heat or cold packs to injured areas.  Limiting activities until pain decreases. HOME CARE INSTRUCTIONS  If you were prescribed an antibiotic medicine, finish it all even if you start to feel better.  Avoid any activities that bring on chest pain.  Do not use any tobacco products, including cigarettes, chewing tobacco, or electronic cigarettes. If you need help quitting, ask your health care provider.  Do not drink alcohol.  Take medicines only as directed by your health care provider.  Keep all follow-up visits as directed by your health care provider. This is important. This includes any further testing if your chest pain does not go  away.  If heartburn is the cause for your chest pain, you may be told to keep your head raised (elevated) while sleeping. This reduces the chance that acid will go from your stomach into your esophagus.  Make lifestyle changes as directed by your health care provider. These may include:  Getting regular exercise. Ask your health care provider to suggest some activities that are safe for you.  Eating a heart-healthy diet. A registered dietitian can help you to learn healthy eating options.  Maintaining a healthy weight.  Managing diabetes, if necessary.  Reducing stress. SEEK MEDICAL CARE IF:  Your chest pain does not go away after treatment.  You have a rash with blisters on your chest.  You have a fever. SEEK IMMEDIATE MEDICAL CARE IF:   Your chest pain is worse.  You have an increasing cough, or you cough up blood.  You have severe abdominal pain.  You have severe weakness.  You faint.  You have chills.  You have sudden, unexplained chest discomfort.  You have sudden, unexplained discomfort in your arms, back, neck, or jaw.  You have shortness of breath at any time.  You suddenly start to sweat, or your skin gets clammy.  You feel nauseous or you vomit.  You suddenly feel light-headed or dizzy.  Your heart begins to beat quickly, or it feels like it is skipping beats. These symptoms may represent a serious problem that is an emergency. Do not wait to see if the symptoms will go away. Get medical help right away. Call your local emergency services (911 in the [place] Do not drive yourself to the hospital.   This information is not intended to replace advice given to you by your health care provider. Make sure you discuss any questions you have with your health care provider.   Document Released: 03/06/2005 Document Revised: 06/17/2014 Document Reviewed: 12/31/2013 Elsevier Interactive Patient Education [employer]

## 2015-12-05 NOTE — ED Notes (Signed)
MD hospitalist at bedside.

## 2015-12-05 NOTE — ED Notes (Signed)
PA at bedside.

## 2015-12-05 NOTE — Consult Note (Signed)
Hospitalist Service Medical Consultation   Gabriel Oysteroel C Friedlander  RUE:[id number]  DOB: September 13, 1965  DOA: 12/05/2015  [facility]     Requesting physician: Derwood KaplanAnkit Nanavati, MD  Reason for consultation: Chest pain   History of Present Illness: Gabriel Potter is an 50 y.o. male with no significant past medical history who presents with chest pain.   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 This resolved after about five minutes, but he left work to go [facility] to [facility] something for his headache, took 2 aspirin, and checked his blood pressure while he was there which was elevated "extremely high" at 160/100 mmHg.  His headache had resolved so he came to the ER.  He has obesity, smokes daily, has a father who had a heart attack at age 52, and has a lot of stress right now. He has no diabetes, his lipids are normal, and he has never been diagnosed with hypertension.  Although these records are not available, he reports having a "heart attack" in 2014, and his PCPs office as documented a nuclear perfusion [facility] done [facility] in 2015 that showed "no decreased activity in the left ventricle on stress imaging to suggest reversible ischemia or infarction with EF 52%".  ED course: -Afebrile, hypertensive -ECG showed a normal sinus rhythm, rate 69, no ST segment or T wave changes -CXR was normal -Initial troponin and repeat at 3 hours were normal     Review of Systems:  As per HPI otherwise all systems were reviewed and were negative.   Past Medical History: Past Medical History  Diagnosis Date  . Bipolar 1 disorder (HCC), doesn't take medicines for this   . Chest pain     [facility]. 05/23/14. No decreased activity in the left  ventricle on stress imaging to suggest reversible ischemia or infarction. Left Ventricular Ejection Fraction: 52%  . MI (myocardial infarction) (HCC)   . Hypertension     Past Surgical History: History reviewed. No pertinent past surgical history.   Allergies:  No Known Allergies   Social History:  reports that he has been smoking Cigarettes.  He has been smoking about 2.00 packs per day. He does not have any smokeless tobacco history on file. He reports that he does not drink alcohol or use illicit drugs.   Family History: Family History  Problem Relation Age of Onset  . Heart disease  50     Physical Exam: Filed Vitals:   12/05/15 1024 12/05/15 1309 12/05/15 1607  BP:  151/88 149/97  Pulse: 81 59 66  Temp: 98.2 F (36.8 C)    TempSrc: Oral    Resp: 17 19 19   SpO2: 100% 97% 100%   General appearance: Well-developed, adult male, alert and in no acute distress.   Eyes: Anicteric, conjunctiva pink, lids and lashes normal.     ENT: No nasal deformity, discharge, or epistaxis.  OP moist without lesions.   Skin: Warm and dry.   Cardiac: RRR, nl S1-S2, no murmurs appreciated.  Capillary refill is brisk.  JVP not visible.  No LE edema.  Radial pulses 2+ and symmetric.  No carotid bruits. Respiratory: Normal respiratory rate  and rhythm.  CTAB without rales or wheezes. GI: Abdomen soft without rigidity.  No TTP. No ascites, distension.   MSK: No deformities or effusions.   Pain not reproduced with palpation of precordium.  No pain with arm movement. Neuro: Sensorium intact and responding to questions, attention normal.  Speech is fluent.  Moves all extremities equally and with normal coordination.    Psych: Behavior appropriate.  Affect pleasant.  No evidence of aural or visual hallucinations or delusions.        Data reviewed:  I have personally reviewed following labs and imaging studies Labs:  CBC:  Recent Labs Lab 12/05/15 1043  WBC 7.2  HGB 14.9  HCT 43.2  MCV  86.7  PLT 185    Basic Metabolic Panel:  Recent Labs Lab 12/05/15 1043  NA 139  K 3.5  CL 107  CO2 24  GLUCOSE 111*  BUN 15  CREATININE 0.76  CALCIUM 9.4   GFR Estimated Creatinine Clearance: 121.8 mL/min (by C-G formula based on Cr of 0.76).  Urinalysis    Component Value Date/Time   COLORURINE YELLOW 05/19/2015 1209   APPEARANCEUR CLEAR 05/19/2015 1209   LABSPEC 1.013 05/19/2015 1209   PHURINE 7.0 05/19/2015 1209   GLUCOSEU NEGATIVE 05/19/2015 1209   HGBUR NEGATIVE 05/19/2015 1209   BILIRUBINUR NEGATIVE 05/19/2015 1209   KETONESUR NEGATIVE 05/19/2015 1209   PROTEINUR NEGATIVE 05/19/2015 1209   UROBILINOGEN 0.2 04/24/2010 0109   NITRITE NEGATIVE 05/19/2015 1209   LEUKOCYTESUR NEGATIVE 05/19/2015 1209    ECG:  The tracing was personally reviewed and showed normal sinus rhythm, normal ST segments and no TWI.  Radiological Exams on Admission: Dg Chest 2 View  12/05/2015  CLINICAL DATA:  Mid chest pain.  Lightheaded.  Shortness of breath. EXAM: CHEST  2 VIEW COMPARISON:  None. FINDINGS: The heart size and mediastinal contours are within normal limits. Both lungs are clear. The visualized skeletal structures are unremarkable. IMPRESSION: Negative two view chest x-ray Electronically Signed   By: Marin Robertshristopher  Mattern M.D.   On: 12/05/2015 10:59    Impression/Recommendations 1. Chest pain: Atypical features, sharp, non-exertional, brief chest pain in setting of stress.   -Ambulatory ETT arranged through Cardiology -Cardiology appointment scheduled   2. Hypertension: -Start HCTZ 25 mg daily -Patient was counseled on anticipated side effects -He will present for a repeat BMP [facility] or at the Cardiology office in 1 week       Thank you for this consultation.     Alberteen Samhristopher P Danford M.D. [facility] 12/05/2015, 5:53 PM

## 2015-12-05 NOTE — ED Notes (Signed)
Patient presents for left sided CP, nausea, HA, lightheadedness, diaphoresis starting PTA. Denies vomiting, A&Ox4.

## 2017-07-24 ENCOUNTER — Emergency Department (HOSPITAL_COMMUNITY): Payer: 59

## 2017-07-24 ENCOUNTER — Encounter (HOSPITAL_COMMUNITY): Payer: Self-pay

## 2017-07-24 ENCOUNTER — Emergency Department (HOSPITAL_COMMUNITY)
Admission: EM | Admit: 2017-07-24 | Discharge: 2017-07-24 | Disposition: A | Discharge status: Home / Self Care | Payer: 59 | Attending: Emergency Medicine | Admitting: Emergency Medicine

## 2017-07-24 DIAGNOSIS — R072 Precordial pain: Secondary | ICD-10-CM | POA: Diagnosis not present

## 2017-07-24 DIAGNOSIS — R079 Chest pain, unspecified: Secondary | ICD-10-CM | POA: Diagnosis not present

## 2017-07-24 DIAGNOSIS — Z79899 Other long term (current) drug therapy: Secondary | ICD-10-CM | POA: Diagnosis not present

## 2017-07-24 DIAGNOSIS — I10 Essential (primary) hypertension: Secondary | ICD-10-CM | POA: Diagnosis not present

## 2017-07-24 DIAGNOSIS — Z7982 Long term (current) use of aspirin: Secondary | ICD-10-CM | POA: Diagnosis not present

## 2017-07-24 DIAGNOSIS — F17210 Nicotine dependence, cigarettes, uncomplicated: Secondary | ICD-10-CM | POA: Diagnosis not present

## 2017-07-24 LAB — BASIC METABOLIC PANEL
Anion gap: 12 (ref 5–15)
BUN: 12 mg/dL (ref 6–20)
CO2: 21 mmol/L — ABNORMAL LOW (ref 22–32)
Calcium: 9.1 mg/dL (ref 8.9–10.3)
Chloride: 106 mmol/L (ref 101–111)
Creatinine, Ser: 0.84 mg/dL (ref 0.61–1.24)
GFR calc Af Amer: >60 mL/min (ref >60)
GFR calc non Af Amer: >60 mL/min (ref >60)
Glucose, Bld: 97 mg/dL (ref 65–99)
Potassium: 3.7 mmol/L (ref 3.5–5.1)
SODIUM: 139 mmol/L (ref 135–145)

## 2017-07-24 LAB — CBC
HEMATOCRIT: 44.3 % (ref 39.0–52.0)
Hemoglobin: 15.2 g/dL (ref 13.0–17.0)
MCH: 30.1 pg (ref 26.0–34.0)
MCHC: 34.3 g/dL (ref 30.0–36.0)
MCV: 87.7 fL (ref 78.0–100.0)
Platelets: 203 10*3/uL (ref 150–400)
RBC: 5.05 MIL/uL (ref 4.22–5.81)
RDW: 13.3 % (ref 11.5–15.5)
WBC: 7.6 10*3/uL (ref 4.0–10.5)

## 2017-07-24 LAB — I-STAT TROPONIN, ED: Troponin i, poc: 0 ng/mL (ref 0.00–0.08)

## 2017-07-24 MED ORDER — OMEPRAZOLE 20 MG PO CPDR: 20.0000 mg | DELAYED_RELEASE_CAPSULE | Freq: Every day | ORAL | 0 refills | Status: DC

## 2017-07-24 NOTE — ED Triage Notes (Signed)
Patient complains of chest pain that started last night and has been constant throughout night. States that the pain is improved when he sits straight up, no cold, no cough

## 2017-07-24 NOTE — ED Notes (Signed)
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

## 2017-07-24 NOTE — ED Notes (Signed)
Arline Aspindy, RN and [place] stuck pt for blood without success. Pt not refusing o get blood drawn and advised he wants to go home.

## 2017-07-24 NOTE — Discharge Instructions (Signed)
Blood work and EKG was reassuring today.   Your Chest Xray shows infiltrate. Please follow up with your regular doctor in the next 6wks for recheck.   Please start taking prilosec daily.  Return to the ER if you have fever, worsening cough, chest pain that radiates to the left shoulder or jaw, trouble breathing or have any new or concerning symptoms.

## 2017-07-24 NOTE — ED Provider Notes (Signed)
[facility] EMERGENCY DEPARTMENT Provider Note   CSN: [id number] Arrival date & time: 07/24/17  5621     History   Chief Complaint Chief Complaint  Patient presents with  . Chest Pain    HPI Gabriel Potter is a 52 y.o. male.  HPI  Gabriel Potter is a 52 year old male with a history of GERD, hypertension, bipolar 1 disorder, tobacco use who presents to the emergency department for evaluation of substernal chest pain.  Patient states that his pain began gradually yesterday afternoon. States pain originally was 10/10 in severity and "sharp" in nature. Pain started in the sternum and radiated into his mouth.  Reports that he has a history of acid reflux and it felt similar. Pain was constant, but seemed to be worsened with movement or palpating over the chest wall.  It is improved with sitting upright and still.  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 At this time he states that his pain has completely resolved as he has been sitting upright for the past hour. States that he has a chronic cough, unchanged recently. Denies associated fever, chills, shortness of breath, nausea/vomiting, lightheadedness, diaphoresis. He denies pleuritic chest pain, history of DVT/PE, recent immobility or surgery. States that he had a brother who died of an MI in his 79s and mother who had a heart attack in her late 24s.  He has a 50-pack-year history, has cut down to 1 cigarette each day. Per chart review he had a negative nuclear medicine stress test in 2015.  Past Medical History:  Diagnosis Date  . Bipolar 1 disorder (HCC)   . Chest pain    [facility]. 05/23/14. No decreased activity in the left ventricle on stress imaging to suggest reversible ischemia or infarction. Left Ventricular Ejection Fraction: 52%  . Hypertension   . MI (myocardial infarction) [facility])     Patient Active Problem  List   Diagnosis Date Noted  . Chest pain 12/05/2015  . Essential hypertension 12/05/2015    History reviewed. No pertinent surgical history.     Home Medications    Prior to Admission medications   Medication Sig Start Date End Date Taking? Authorizing Provider  Acetaminophen (PAIN RELIEVER NO ASA EX ST PO) Take 2 tablets by mouth once.     [provider]  albuterol (PROVENTIL HFA;VENTOLIN HFA) 108 (90 BASE) MCG/ACT inhaler Inhale 1-2 puffs into the lungs every 6 (six) hours as needed for wheezing or shortness of breath (cough). 05/19/15   Leta Baptist, MD  aspirin 81 MG tablet Take 81 mg by mouth daily.    [provider]  bacitracin ointment Apply 1 application topically 2 (two) times daily. 12/04/15   Kirichenko, Lemont Fillers, PA-C  doxycycline (VIBRAMYCIN) 100 MG capsule Take 1 capsule (100 mg total) by mouth 2 (two) times daily. One po bid x 7 days Patient not taking: Reported on 12/04/2015 09/30/15   Joycie Peek, PA-C  hydrochlorothiazide (HYDRODIURIL) 25 MG tablet Take 1 tablet (25 mg total) by mouth daily. 12/05/15   Derwood Kaplan, MD  HYDROcodone-acetaminophen (HYCET) 7.5-325 mg/15 ml solution Take 10-15 mLs by mouth every 6 (six) hours as needed. Patient not taking: Reported on 12/04/2015 06/24/15   Elvina Sidle, MD  HYDROcodone-acetaminophen (NORCO) 5-325 MG tablet Take 1 tablet by mouth every 6 (six) hours as needed for moderate pain. Patient not taking: Reported on 12/04/2015 06/24/15  Elvina Sidle, MD  lidocaine (LIDODERM) 5 % Place 1 patch onto the skin daily. Remove & Discard patch within 12 hours or as directed by MD Patient taking differently: Place 1 patch onto the skin daily as needed (for pain). Remove & Discard patch within 12 hours or as directed by MD 10/31/15   Joy, Shawn C, PA-C  methocarbamol (ROBAXIN) 500 MG tablet Take 1 tablet (500 mg total) by mouth 2 (two) times daily. Patient taking differently: Take 500 mg by mouth 2 (two)  times daily as needed for muscle spasms.  10/31/15   Joy, Shawn C, PA-C  naproxen (NAPROSYN) 500 MG tablet Take 1 tablet (500 mg total) by mouth 2 (two) times daily. Patient taking differently: Take 500 mg by mouth 2 (two) times daily as needed for mild pain.  10/31/15   Joy, Shawn C, PA-C  oxyCODONE-acetaminophen (PERCOCET/ROXICET) 5-325 MG tablet Take 1-2 tablets by mouth every 4 (four) hours as needed for severe pain. 10/31/15   Joy, Hillard Danker, PA-C    Family History Family History  Problem Relation Age of Onset  . Heart disease Unknown 16    Social History Social History   Tobacco Use  . Smoking status: Current Every Day Smoker    Packs/day: 2.00    Types: Cigarettes  . Smokeless tobacco: Never Used  Substance Use Topics  . Alcohol use: No  . Drug use: No     Allergies   Patient has no known allergies.   Review of Systems Review of Systems  Constitutional: Negative for chills and fever.  Eyes: Negative for visual disturbance.  Respiratory: Negative for cough, shortness of breath and wheezing.   Cardiovascular: Positive for chest pain. Negative for leg swelling.  Gastrointestinal: Negative for abdominal pain, diarrhea, nausea and vomiting.  Genitourinary: Negative for dysuria.  Musculoskeletal: Negative for back pain.  Skin: Negative for rash and wound.  Neurological: Negative for dizziness, weakness, light-headedness, numbness and headaches.  Psychiatric/Behavioral: Negative for confusion.     Physical Exam Updated Vital Signs BP (!) 142/90 (BP Location: Right Arm)   Pulse 79   Temp 98 F (36.7 C) (Oral)   Resp 16   Ht 5\' 10"  (1.778 m)   Wt 93 kg (205 lb)   SpO2 99%   BMI 29.41 kg/m   Physical Exam  Constitutional: He is oriented to person, place, and time. He appears well-developed and well-nourished. No distress.  HENT:  Head: Normocephalic and atraumatic.  Mouth/Throat: Oropharynx is clear and moist. No oropharyngeal exudate.  Eyes: Conjunctivae are  normal. Pupils are equal, round, and reactive to light. Right eye exhibits no discharge. Left eye exhibits no discharge.  Neck: Normal range of motion. Neck supple. No JVD present. No tracheal deviation present.  Cardiovascular: Normal rate, regular rhythm and intact distal pulses. Exam reveals no friction rub.  No murmur heard. Pulmonary/Chest: Effort normal and breath sounds normal. No stridor. No respiratory distress. He has no wheezes. He has no rales.  Sternum and bilateral chest wall tender to palpation. No overlying rash, bruising or wound.   Abdominal: Soft. Bowel sounds are normal. There is no tenderness. There is no guarding.  No epigastric tenderness.  Musculoskeletal:  No leg swelling or calf tenderness.  Lymphadenopathy:    He has no cervical adenopathy.  Neurological: He is alert and oriented to person, place, and time. Coordination normal.  Skin: Skin is warm and dry. Capillary refill takes less than 2 seconds. He is not diaphoretic.  Psychiatric: He has  a normal mood and affect. His behavior is normal.  Nursing note and vitals reviewed.    ED Treatments / Results  Labs (all labs ordered are listed, but only abnormal results are displayed) Labs Reviewed  BASIC METABOLIC PANEL - Abnormal; Notable for the following components:      Result Value   CO2 21 (*)    All other components within normal limits  CBC  I-STAT TROPONIN, ED  I-STAT TROPONIN, ED    EKG  EKG Interpretation None       Radiology Dg Chest 2 View  Result Date: 07/24/2017 CLINICAL DATA:  Chest pain. EXAM: CHEST  2 VIEW COMPARISON:  12/05/2015. FINDINGS: Mediastinum and hilar structures normal. Heart size normal. Mild infiltrate left upper lobe. Follow-up chest x-ray to demonstrate clearing suggested. No pleural effusion or pneumothorax. IMPRESSION: Mild infiltrate left upper lobe. Followup PA and lateral chest X-ray is recommended in 3-4 weeks following trial of antibiotic therapy to ensure  resolution and exclude underlying malignancy. Electronically Signed   By: Maisie Fushomas  Register   On: 07/24/2017 09:54    Procedures Procedures (including critical care time)  Medications Ordered in ED Medications - No data to display   Initial Impression / Assessment and Plan / ED Course  I have reviewed the triage vital signs and the nursing notes.  Pertinent labs & imaging results that were available during my care of the patient were reviewed by me and considered in my medical decision making (see chart for details).     Patient is to be discharged with recommendation to follow up with PCP in regards to today's hospital visit. Suspect his pain is MSK given worsening with movement and palpation. It is also possible that GERD symptoms are contributing given sensation radiates up his neck and into his mouth and he has a history of similar. Will begin PPI.   Do not suspect ACS given presentation, negative troponin, EKG without ischemic changes. Do not suspect PE given no pleuritic CP, no tachycardia or tachypnea, no leg swelling or calf tenderness, no recent surgery or immobilization. No concern for Aortic Dissection given pulses equal, no neurologic symptoms, no new murmur, no back pain.   Chest xray reveals left upper lobe infiltrate. No fever, chills or new cough to suggest pneumonia. He does have a significant past history of tobacco use (approx 50pk/yrs). Have counseled patient that he will need to follow up with his PCP regarding his CXR today. Patient agrees and voices understanding to this plan. Pt has been advised to return to the ED if CP becomes exertional, associated with diaphoresis or nausea, radiates to left jaw/arm, worsens or becomes concerning in any way. Pt appears reliable for follow up and is agreeable to discharge.   Case has been discussed with Dr. Effie ShyWentz who agrees with the above plan to discharge.   Final Clinical Impressions(s) / ED Diagnoses   Final diagnoses:  Chest  pain, unspecified type    ED Discharge Orders        Ordered    omeprazole (PRILOSEC) 20 MG capsule  Daily     07/24/17 1424       Lawrence MarseillesShrosbree, Emily J, PA-C 07/24/17 2155    Mancel BaleWentz, Elliott, MD 07/25/17 1125

## 2018-07-16 ENCOUNTER — Emergency Department (HOSPITAL_COMMUNITY)
Admission: EM | Admit: 2018-07-16 | Discharge: 2018-07-16 | Disposition: A | Discharge status: Home / Self Care | Payer: 59 | Attending: Emergency Medicine | Admitting: Emergency Medicine

## 2018-07-16 ENCOUNTER — Encounter (HOSPITAL_COMMUNITY): Payer: Self-pay | Admitting: Emergency Medicine

## 2018-07-16 DIAGNOSIS — J111 Influenza due to unidentified influenza virus with other respiratory manifestations: Secondary | ICD-10-CM | POA: Insufficient documentation

## 2018-07-16 DIAGNOSIS — R69 Illness, unspecified: Secondary | ICD-10-CM

## 2018-07-16 DIAGNOSIS — I10 Essential (primary) hypertension: Secondary | ICD-10-CM | POA: Insufficient documentation

## 2018-07-16 DIAGNOSIS — F17210 Nicotine dependence, cigarettes, uncomplicated: Secondary | ICD-10-CM | POA: Diagnosis not present

## 2018-07-16 DIAGNOSIS — Z79899 Other long term (current) drug therapy: Secondary | ICD-10-CM | POA: Insufficient documentation

## 2018-07-16 DIAGNOSIS — M7918 Myalgia, other site: Secondary | ICD-10-CM | POA: Diagnosis present

## 2018-07-16 DIAGNOSIS — Z7982 Long term (current) use of aspirin: Secondary | ICD-10-CM | POA: Diagnosis not present

## 2018-07-16 MED ORDER — OSELTAMIVIR PHOSPHATE 75 MG PO CAPS: 75.0000 mg | ORAL_CAPSULE | Freq: Two times a day (BID) | ORAL | 0 refills | Status: AC

## 2018-07-16 MED ORDER — ACETAMINOPHEN 500 MG PO TABS
1000.0000 mg | ORAL_TABLET | Freq: Once | ORAL | Status: AC
  Administered 2018-07-16: 1000 mg via ORAL
  Filled 2018-07-16: qty 2

## 2018-07-16 MED ORDER — FLUTICASONE PROPIONATE 50 MCG/ACT NA SUSP: 2.0000 | Freq: Every day | NASAL | 0 refills | Status: DC

## 2018-07-16 MED ORDER — ALBUTEROL SULFATE HFA 108 (90 BASE) MCG/ACT IN AERS
2.0000 | INHALATION_SPRAY | Freq: Once | RESPIRATORY_TRACT | Status: AC
  Administered 2018-07-16: 2 via RESPIRATORY_TRACT
  Filled 2018-07-16: qty 6.7

## 2018-07-16 MED ORDER — BENZONATATE 100 MG PO CAPS: 100.0000 mg | ORAL_CAPSULE | Freq: Three times a day (TID) | ORAL | 0 refills | Status: AC

## 2018-07-16 NOTE — ED Triage Notes (Signed)
Pt arrives with reports of hurting all over that started yesterday. Endorses nausea and a fever last night of 101. Pt took tylenol for fever last night.

## 2018-07-16 NOTE — Discharge Instructions (Addendum)
You were given a prescription for Tamiflu.  Be aware that this medication may make you have nausea, vomiting, abdominal pain, or diarrhea.  This medication can also cause mood derangement. If you experience any side effects that you cannot tolerate, then you should stop taking the medication. Please make sure to take stay hydrated while you are taking this medication.  Take the inhaler 2 times every 6 hours as needed for cough or shortness of breath.  You should follow up with your primary healthcare provider within the next 3-5 days for reevaluation.  You will need to return to the emergency department immediately if you experience any of the following symptoms:  Difficulty breathing or shortness or breath Pain or pressure in the chest or abdomen Sudden dizziness Confusion Severe or persistent vomiting Flu-like symptoms that improve but then return with fever or worse cough  You should stay home for at least 24 hours after your fever is gone except to get medical care or other necessities. Your fever should be gone without the need to use a fever-reducing medicine, such as Tylenol or Motrin. Until then, you should stay home from work, school, travel, shopping, social events, and public gatherings.  Stay away from others as much as possible to keep from infecting them. If you must leave home, for example to get medical care, wear a facemask if you have one, or cover coughs and sneezes with a tissue. Wash your hands often to keep from spreading flu to others

## 2018-07-16 NOTE — ED Notes (Signed)
Patient verbalizes understanding of discharge instructions. Opportunity for questioning and answers were provided. Armband removed by staff, pt discharged from ED. Ambulated out to lobby  

## 2018-07-16 NOTE — ED Provider Notes (Signed)
[facility] EMERGENCY DEPARTMENT Provider Note   CSN: [id number] Arrival date & time: 07/16/18  1937     History   Chief Complaint Chief Complaint  Patient presents with  . Generalized Body Aches    HPI Gabriel Potter is a 53 y.o. male.  HPI   Pt is a 53 y/o male with a h/o bipolar 1, HTN, MI, who presents to the ED today c/o sore throat, congestion, cough, body aches, nausea, fevers. Sxs began yesterday. Sxs constant and worsened this morning. He took tylenol and ibuprofen last night which improved fevers.  He has not taken BP meds. No CP or SOB.   He did not receive a flu shot this year.  Past Medical History:  Diagnosis Date  . Bipolar 1 disorder (HCC)   . Chest pain    [facility]. 05/23/14. No decreased activity in the left ventricle on stress imaging to suggest reversible ischemia or infarction. Left Ventricular Ejection Fraction: 52%  . Hypertension   . MI (myocardial infarction) [facility])     Patient Active Problem List   Diagnosis Date Noted  . Chest pain 12/05/2015  . Essential hypertension 12/05/2015    History reviewed. No pertinent surgical history.      Home Medications    Prior to Admission medications   Medication Sig Start Date End Date Taking? Authorizing Provider  Acetaminophen (PAIN RELIEVER NO ASA EX ST PO) Take 2 tablets by mouth once.     [provider]  albuterol (PROVENTIL HFA;VENTOLIN HFA) 108 (90 BASE) MCG/ACT inhaler Inhale 1-2 puffs into the lungs every 6 (six) hours as needed for wheezing or shortness of breath (cough). 05/19/15   Leta Baptist, MD  aspirin 81 MG tablet Take 81 mg by mouth daily.    [provider]  bacitracin ointment Apply 1 application topically 2 (two) times daily. 12/04/15   Kirichenko, Tatyana, PA-C  benzonatate (TESSALON) 100 MG capsule Take 1 capsule (100 mg total) by mouth every 8 (eight) hours for 5 days. 07/16/18 07/21/18  Ramiah Helfrich S, PA-C  doxycycline  (VIBRAMYCIN) 100 MG capsule Take 1 capsule (100 mg total) by mouth 2 (two) times daily. One po bid x 7 days Patient not taking: Reported on 12/04/2015 09/30/15   Joycie Peek, PA-C  fluticasone [facility]) 50 MCG/ACT nasal spray Place 2 sprays into both nostrils daily. 07/16/18   Elivia Robotham S, PA-C  hydrochlorothiazide (HYDRODIURIL) 25 MG tablet Take 1 tablet (25 mg total) by mouth daily. 12/05/15   Derwood Kaplan, MD  HYDROcodone-acetaminophen (HYCET) 7.5-325 mg/15 ml solution Take 10-15 mLs by mouth every 6 (six) hours as needed. Patient not taking: Reported on 12/04/2015 06/24/15   Elvina Sidle, MD  HYDROcodone-acetaminophen (NORCO) 5-325 MG tablet Take 1 tablet by mouth every 6 (six) hours as needed for moderate pain. Patient not taking: Reported on 12/04/2015 06/24/15   Elvina Sidle, MD  lidocaine (LIDODERM) 5 % Place 1 patch onto the skin daily. Remove & Discard patch within 12 hours or as directed by MD Patient taking differently: Place 1 patch onto the skin daily as needed (for pain). Remove & Discard patch within 12 hours or as directed by MD 10/31/15   Joy, Shawn C, PA-C  methocarbamol (ROBAXIN) 500 MG tablet Take 1 tablet (500 mg total) by mouth 2 (two) times daily. Patient taking differently: Take 500 mg by mouth 2 (two) times daily as needed for muscle spasms.  10/31/15   Joy, Shawn C, PA-C  naproxen (  NAPROSYN) 500 MG tablet Take 1 tablet (500 mg total) by mouth 2 (two) times daily. Patient taking differently: Take 500 mg by mouth 2 (two) times daily as needed for mild pain.  10/31/15   Joy, Shawn C, PA-C  omeprazole (PRILOSEC) 20 MG capsule Take 1 capsule (20 mg total) by mouth daily. 07/24/17   Kellie ShropshireShrosbree, Emily J, PA-C  oseltamivir (TAMIFLU) 75 MG capsule Take 1 capsule (75 mg total) by mouth every 12 (twelve) hours for 5 days. 07/16/18 07/21/18  Latressa Harries S, PA-C  oxyCODONE-acetaminophen (PERCOCET/ROXICET) 5-325 MG tablet Take 1-2 tablets by mouth every 4 (four) hours as needed  for severe pain. 10/31/15   Joy, Hillard DankerShawn C, PA-C    Family History Family History  Problem Relation Age of Onset  . Heart disease Other 4650    Social History Social History   Tobacco Use  . Smoking status: Current Every Day Smoker    Packs/day: 2.00    Types: Cigarettes  . Smokeless tobacco: Never Used  Substance Use Topics  . Alcohol use: No  . Drug use: No     Allergies   Patient has no known allergies.   Review of Systems Review of Systems  Constitutional: Positive for chills, diaphoresis and fever.  HENT: Positive for congestion and sore throat. Negative for ear pain and rhinorrhea.   Eyes: Negative for visual disturbance.  Respiratory: Positive for cough. Negative for shortness of breath.   Cardiovascular: Negative for chest pain and leg swelling.  Gastrointestinal: Positive for nausea. Negative for abdominal pain, constipation, diarrhea and vomiting.  Genitourinary: Negative for flank pain.  Musculoskeletal: Positive for myalgias.  Skin: Negative for rash.  Neurological: Negative for dizziness, weakness, light-headedness and numbness.  All other systems reviewed and are negative.    Physical Exam Updated Vital Signs BP (!) 174/94 (BP Location: Right Arm)   Pulse 99   Temp 99 F (37.2 C) (Oral)   Resp 20   Ht 5\' 10"  (1.778 m)   Wt 93 kg   SpO2 97%   BMI 29.41 kg/m   Physical Exam Vitals signs and nursing note reviewed.  Constitutional:      Appearance: He is well-developed.  HENT:     Head: Normocephalic and atraumatic.     Ears:     Comments: Lateral TMs are bulging.  No effusion or erythema noted.    Nose: Congestion present.     Mouth/Throat:     Mouth: Mucous membranes are moist.     Pharynx: No oropharyngeal exudate or posterior oropharyngeal erythema.  Eyes:     Extraocular Movements: Extraocular movements intact.     Conjunctiva/sclera: Conjunctivae normal.     Pupils: Pupils are equal, round, and reactive to light.  Neck:      Musculoskeletal: Neck supple.  Cardiovascular:     Rate and Rhythm: Normal rate and regular rhythm.     Heart sounds: Normal heart sounds. No murmur.  Pulmonary:     Effort: Pulmonary effort is normal. No respiratory distress.     Breath sounds: Normal breath sounds. No stridor. No wheezing or rhonchi.  Abdominal:     General: Bowel sounds are normal.     Palpations: Abdomen is soft.     Tenderness: There is no abdominal tenderness.  Lymphadenopathy:     Cervical: Cervical adenopathy present.  Skin:    General: Skin is warm and dry.  Neurological:     Mental Status: He is alert.  Psychiatric:  Mood and Affect: Mood normal.      ED Treatments / Results  Labs (all labs ordered are listed, but only abnormal results are displayed) Labs Reviewed - No data to display  EKG None  Radiology No results found.  Procedures Procedures (including critical care time)  Medications Ordered in ED Medications  acetaminophen (TYLENOL) tablet 1,000 mg (1,000 mg Oral Given 07/16/18 0907)  albuterol (PROVENTIL HFA;VENTOLIN HFA) 108 (90 Base) MCG/ACT inhaler 2 puff (2 puffs Inhalation Given 07/16/18 0907)     Initial Impression / Assessment and Plan / ED Course  I have reviewed the triage vital signs and the nursing notes.  Pertinent labs & imaging results that were available during my care of the patient were reviewed by me and considered in my medical decision making (see chart for details).     Final Clinical Impressions(s) / ED Diagnoses   Final diagnoses:  Influenza-like illness   Patient with symptoms consistent with influenza.  Vitals are grossly stable, low-grade temp.  He is HTN, has not taken meds. No signs of HTN emergency. No signs of dehydration, tolerating PO's.  Lungs are clear. Due to patient's presentation and physical exam a chest x-ray was not ordered bc likely diagnosis of flu.  Discussed the cost versus benefit of Tamiflu treatment with the patient. Discussed  side effect profile of tamiflu. Pt aware.  Patient will be discharged with instructions to orally hydrate, rest, and use over-the-counter medications such as anti-inflammatories ibuprofen and Aleve for muscle aches and Tylenol for fever.  Patient will also be given rx for symptomatic care. Advised f/u with pcp and to return if worse. He voices understanding and is in agreement.    ED Discharge Orders         Ordered    oseltamivir (TAMIFLU) 75 MG capsule  Every 12 hours     07/16/18 0902    benzonatate (TESSALON) 100 MG capsule  Every 8 hours     07/16/18 0902    fluticasone (FLONASE) 50 MCG/ACT nasal spray  Daily     07/16/18 0902           Karrie Meres, PA-C 07/16/18 0912    Charlynne Pander, MD 07/16/18 1550

## 2019-06-02 ENCOUNTER — Other Ambulatory Visit: Payer: Self-pay

## 2019-06-02 ENCOUNTER — Encounter (HOSPITAL_COMMUNITY): Payer: Self-pay

## 2019-06-02 ENCOUNTER — Ambulatory Visit (INDEPENDENT_AMBULATORY_CARE_PROVIDER_SITE_OTHER): Payer: 59

## 2019-06-02 ENCOUNTER — Ambulatory Visit (HOSPITAL_COMMUNITY)
Admission: EM | Admit: 2019-06-02 | Discharge: 2019-06-02 | Disposition: A | Discharge status: Home / Self Care | Payer: 59 | Attending: Family Medicine | Admitting: Family Medicine

## 2019-06-02 DIAGNOSIS — S93492A Sprain of other ligament of left ankle, initial encounter: Secondary | ICD-10-CM

## 2019-06-02 MED ORDER — OXYCODONE-ACETAMINOPHEN 5-325 MG PO TABS: 2.0000 | ORAL_TABLET | ORAL | 0 refills | Status: DC | PRN

## 2019-06-02 NOTE — ED Provider Notes (Signed)
[facility]    CSN: [id number] Arrival date & time: 06/02/19  1310      History   Chief Complaint Chief Complaint  Patient presents with  . Ankle Injury    HPI Gabriel Potter is a 53 y.o. male.   Is the initial visit for this 53 year old man who twisted his left ankle today.   Patient presents to Urgent Care with complaints of left ankle pain since this morning. Patient reports his foot fell off the side of his driveway and it twisted left and right. Pt has a video on his phone from the incident to show this RN, pt able to ambulate on the foot but gait suggests significant pain.        Past Medical History:  Diagnosis Date  . Bipolar 1 disorder (HCC)   . Chest pain    [facility]. 05/23/14. No decreased activity in the left ventricle on stress imaging to suggest reversible ischemia or infarction. Left Ventricular Ejection Fraction: 52%  . Hypertension   . MI (myocardial infarction) [facility])     Patient Active Problem List   Diagnosis Date Noted  . Chest pain 12/05/2015  . Essential hypertension 12/05/2015    History reviewed. No pertinent surgical history.     Home Medications    Prior to Admission medications   Medication Sig Start Date End Date Taking? Authorizing Provider  Acetaminophen (PAIN RELIEVER NO ASA EX ST PO) Take 2 tablets by mouth once.     [provider]  albuterol (PROVENTIL HFA;VENTOLIN HFA) 108 (90 BASE) MCG/ACT inhaler Inhale 1-2 puffs into the lungs every 6 (six) hours as needed for wheezing or shortness of breath (cough). 05/19/15   Leta Baptist, MD  aspirin 81 MG tablet Take 81 mg by mouth daily.    [provider]  bacitracin ointment Apply 1 application topically 2 (two) times daily. 12/04/15   Kirichenko, Tatyana, PA-C  fluticasone (FLONASE) 50 MCG/ACT nasal spray Place 2 sprays into both nostrils daily. 07/16/18   Couture, Cortni S, PA-C  hydrochlorothiazide (HYDRODIURIL) 25 MG tablet Take 1 tablet  (25 mg total) by mouth daily. 12/05/15   Derwood Kaplan, MD  omeprazole (PRILOSEC) 20 MG capsule Take 1 capsule (20 mg total) by mouth daily. 07/24/17   Kellie Shropshire, PA-C  oxyCODONE-acetaminophen (PERCOCET/ROXICET) 5-325 MG tablet Take 2 tablets by mouth every 4 (four) hours as needed for severe pain. 06/02/19   Elvina Sidle, MD    Family History Family History  Problem Relation Age of Onset  . Heart failure Mother   . Asthma Mother   . Cancer Father   . Heart disease Other 2    Social History Social History   Tobacco Use  . Smoking status: Current Every Day Smoker    Packs/day: 1.00    Types: Cigarettes  . Smokeless tobacco: Never Used  . Tobacco comment: down to 1 pack per day from 2  Substance Use Topics  . Alcohol use: No  . Drug use: No     Allergies   Patient has no known allergies.   Review of Systems Review of Systems   Physical Exam Triage Vital Signs ED Triage Vitals  Enc Vitals Group     BP 06/02/19 1322 (!) 167/80     Pulse Rate 06/02/19 1322 94     Resp 06/02/19 1322 16     Temp 06/02/19 1322 98.3 F (36.8 C)     Temp Source 06/02/19 1322 Oral  SpO2 06/02/19 1322 98 %     Weight --      Height --      Head Circumference --      Peak Flow --      Pain Score 06/02/19 1319 6     Pain Loc --      Pain Edu? --      Excl. in [place]? --    No data found.  Updated Vital Signs BP (!) 167/80 (BP Location: Right Arm)   Pulse 94   Temp 98.3 F (36.8 C) (Oral)   Resp 16   SpO2 98%    Physical Exam Vitals and nursing note reviewed.  Constitutional:      Appearance: Normal appearance.  Pulmonary:     Effort: Pulmonary effort is normal.  Musculoskeletal:        General: Swelling, tenderness and signs of injury present. No deformity.     Cervical back: Normal range of motion and neck supple.  Skin:    General: Skin is warm and dry.  Neurological:     General: No focal deficit present.     Mental Status: He is alert.  Psychiatric:         Mood and Affect: Mood normal.      UC Treatments / Results  Labs (all labs ordered are listed, but only abnormal results are displayed) Labs Reviewed - No data to display  EKG   Radiology DG Ankle Complete Left  Result Date: 06/02/2019 CLINICAL DATA:  Left ankle pain since a fall and twisting injury today. Initial encounter. EXAM: LEFT ANKLE COMPLETE - 3+ VIEW COMPARISON:  None. FINDINGS: There is no evidence of fracture, dislocation, or joint effusion. There is no evidence of arthropathy or other focal bone abnormality. Soft tissues are unremarkable. IMPRESSION: Negative exam. Electronically Signed   By: Inge Rise M.D.   On: 06/02/2019 14:15    Procedures Procedures (including critical care time)  Medications Ordered in UC Medications - No data to display  Initial Impression / Assessment and Plan / UC Course  I have reviewed the triage vital signs and the nursing notes.  Pertinent labs & imaging results that were available during my care of the patient were reviewed by me and considered in my medical decision making (see chart for details).    Final Clinical Impressions(s) / UC Diagnoses   Final diagnoses:  Sprain of anterior talofibular ligament of left ankle, initial encounter     Discharge Instructions     Covid prevention Vitamin D3 5000 international   (125 mg) daily Vitamin C 500 mg twice daily Zinc 50 to 75 mg daily Listerine type mouthwash every 4-6 hours     ED Prescriptions    Medication Sig Dispense Auth. Provider   oxyCODONE-acetaminophen (PERCOCET/ROXICET) 5-325 MG tablet Take 2 tablets by mouth every 4 (four) hours as needed for severe pain. 12 tablet Robyn Haber, MD     I have reviewed the PDMP during this encounter.   Robyn Haber, MD 06/02/19 1431

## 2019-06-02 NOTE — ED Triage Notes (Signed)
Patient presents to Urgent Care with complaints of left ankle pain since this morning. Patient reports his foot fell off the side of his driveway and it twisted left and right. Pt has a video on his phone from the incident to show this RN, pt able to ambulate on the foot but gait suggests significant pain.

## 2019-06-02 NOTE — Discharge Instructions (Signed)
Covid prevention Vitamin D3 5000 international   (125 mg) daily Vitamin C 500 mg twice daily Zinc 50 to 75 mg daily Listerine type mouthwash every 4-6 hours

## 2020-05-19 ENCOUNTER — Emergency Department (HOSPITAL_COMMUNITY)
Admission: EM | Admit: 2020-05-19 | Discharge: 2020-05-19 | Disposition: A | Discharge status: Home / Self Care | Payer: 59 | Attending: Emergency Medicine | Admitting: Emergency Medicine

## 2020-05-19 ENCOUNTER — Other Ambulatory Visit: Payer: Self-pay

## 2020-05-19 DIAGNOSIS — J0100 Acute maxillary sinusitis, unspecified: Secondary | ICD-10-CM | POA: Insufficient documentation

## 2020-05-19 DIAGNOSIS — I10 Essential (primary) hypertension: Secondary | ICD-10-CM | POA: Insufficient documentation

## 2020-05-19 DIAGNOSIS — Z7982 Long term (current) use of aspirin: Secondary | ICD-10-CM | POA: Insufficient documentation

## 2020-05-19 DIAGNOSIS — F17210 Nicotine dependence, cigarettes, uncomplicated: Secondary | ICD-10-CM | POA: Diagnosis not present

## 2020-05-19 DIAGNOSIS — Z20822 Contact with and (suspected) exposure to covid-19: Secondary | ICD-10-CM | POA: Diagnosis not present

## 2020-05-19 DIAGNOSIS — R519 Headache, unspecified: Secondary | ICD-10-CM | POA: Insufficient documentation

## 2020-05-19 DIAGNOSIS — R059 Cough, unspecified: Secondary | ICD-10-CM | POA: Diagnosis present

## 2020-05-19 DIAGNOSIS — R0982 Postnasal drip: Secondary | ICD-10-CM | POA: Diagnosis not present

## 2020-05-19 DIAGNOSIS — J320 Chronic maxillary sinusitis: Secondary | ICD-10-CM

## 2020-05-19 LAB — RESP PANEL BY RT-PCR (FLU A&B, COVID) ARPGX2
Influenza A by PCR: NEGATIVE
Influenza B by PCR: NEGATIVE
SARS Coronavirus 2 by RT PCR: NEGATIVE

## 2020-05-19 MED ORDER — AMOXICILLIN-POT CLAVULANATE 875-125 MG PO TABS: 1.0000 | ORAL_TABLET | Freq: Two times a day (BID) | ORAL | 0 refills | Status: AC

## 2020-05-19 MED ORDER — BENZONATATE 100 MG PO CAPS: 100.0000 mg | ORAL_CAPSULE | Freq: Two times a day (BID) | ORAL | 0 refills | Status: AC

## 2020-05-19 MED ORDER — FLUTICASONE PROPIONATE 50 MCG/ACT NA SUSP: 2.0000 | Freq: Every day | NASAL | 0 refills | Status: DC

## 2020-05-19 NOTE — ED Triage Notes (Signed)
Increase cough and congestion for a week, denies fever, chills or body aches.

## 2020-05-19 NOTE — ED Provider Notes (Signed)
[facility] EMERGENCY DEPARTMENT Provider Note   CSN: [id number] Arrival date & time: 05/19/20  1328     History Chief Complaint  Patient presents with  . Cough    Gabriel Potter is a 54 y.o. male present emergency department with sinus congestion for 7 days and headache.  He reports a frontal headache and congestion for 7 days, with persistent coughing significantly worse when laying down at night.  He has tried many over-the-counter remedies but nothing relieves his cough at night, and states he has not slept in 7 days because of this.  He denies fevers or chills.  He reports he received 1 Covid vaccine but not the full series.  He denies any sick contacts in the house, including his wife whom he lives with.  He has tried DayQuil, NyQuil, tea with honey, Vick's vapor rub at home, with no relief.  He had a history of pneumonia in 2019 but states the symptoms do not feel like that.  HPI     Past Medical History:  Diagnosis Date  . Bipolar 1 disorder (HCC)   . Chest pain    [facility]. 05/23/14. No decreased activity in the left ventricle on stress imaging to suggest reversible ischemia or infarction. Left Ventricular Ejection Fraction: 52%  . Hypertension   . MI (myocardial infarction) [facility])     Patient Active Problem List   Diagnosis Date Noted  . Chest pain 12/05/2015  . Essential hypertension 12/05/2015    No past surgical history on file.     Family History  Problem Relation Age of Onset  . Heart failure Mother   . Asthma Mother   . Cancer Father   . Heart disease Other 53    Social History   Tobacco Use  . Smoking status: Current Every Day Smoker    Packs/day: 1.00    Types: Cigarettes  . Smokeless tobacco: Never Used  . Tobacco comment: down to 1 pack per day from 2  Vaping Use  . Vaping Use: Never used  Substance Use Topics  . Alcohol use: No  . Drug use: No    Home Medications Prior to Admission medications    Medication Sig Start Date End Date Taking? Authorizing Provider  Acetaminophen (PAIN RELIEVER NO ASA EX ST PO) Take 2 tablets by mouth once.     [provider]  albuterol (PROVENTIL HFA;VENTOLIN HFA) 108 (90 BASE) MCG/ACT inhaler Inhale 1-2 puffs into the lungs every 6 (six) hours as needed for wheezing or shortness of breath (cough). 05/19/15   Leta Baptist, MD  amoxicillin-clavulanate (AUGMENTIN) 875-125 MG tablet Take 1 tablet by mouth every 12 (twelve) hours for 7 days. 05/19/20 05/26/20  Terald Sleeper, MD  aspirin 81 MG tablet Take 81 mg by mouth daily.    [provider]  bacitracin ointment Apply 1 application topically 2 (two) times daily. 12/04/15   Kirichenko, Tatyana, PA-C  benzonatate (TESSALON) 100 MG capsule Take 1 capsule (100 mg total) by mouth 2 (two) times daily for 10 days. 05/19/20 05/29/20  Terald Sleeper, MD  fluticasone (FLONASE) 50 MCG/ACT nasal spray Place 2 sprays into both nostrils daily. 07/16/18   Couture, Cortni S, PA-C  fluticasone (FLONASE) 50 MCG/ACT nasal spray Place 2 sprays into both nostrils daily for 3 days. 05/19/20 05/22/20  Terald Sleeper, MD  hydrochlorothiazide (HYDRODIURIL) 25 MG tablet Take 1 tablet (25 mg total) by mouth daily. 12/05/15   Derwood Kaplan, MD  omeprazole (PRILOSEC) 20 MG capsule Take 1 capsule (20 mg total) by mouth daily. 07/24/17   Kellie Shropshire, PA-C  oxyCODONE-acetaminophen (PERCOCET/ROXICET) 5-325 MG tablet Take 2 tablets by mouth every 4 (four) hours as needed for severe pain. 06/02/19   Elvina Sidle, MD    Allergies    Patient has no known allergies.  Review of Systems   Review of Systems  Constitutional: Negative for chills and fever.  HENT: Positive for congestion and postnasal drip.   Eyes: Negative for photophobia and visual disturbance.  Respiratory: Positive for cough. Negative for shortness of breath.   Cardiovascular: Negative for chest pain and palpitations.  Gastrointestinal:  Negative for abdominal pain and vomiting.  Musculoskeletal: Positive for arthralgias and myalgias.  Skin: Negative for color change and rash.  Neurological: Positive for headaches. Negative for syncope.  Psychiatric/Behavioral: Positive for sleep disturbance. Negative for hallucinations.  All other systems reviewed and are negative.   Physical Exam Updated Vital Signs BP (!) 180/74   Pulse 84   Temp 98.1 F (36.7 C) (Oral)   Resp 16   Ht 5\' 9"  (1.753 m)   Wt 90.7 kg   SpO2 97%   BMI 29.53 kg/m   Physical Exam Vitals and nursing note reviewed.  Constitutional:      Appearance: He is well-developed and well-nourished.  HENT:     Head: Normocephalic and atraumatic.     Comments: Maxillary sinus ttp bilaterally Eyes:     Conjunctiva/sclera: Conjunctivae normal.  Cardiovascular:     Rate and Rhythm: Normal rate and regular rhythm.     Pulses: Normal pulses.  Pulmonary:     Effort: Pulmonary effort is normal. No respiratory distress.  Abdominal:     Palpations: Abdomen is soft.     Tenderness: There is no abdominal tenderness.  Musculoskeletal:        General: No edema.     Cervical back: Neck supple.  Skin:    General: Skin is warm and dry.  Neurological:     Mental Status: He is alert.  Psychiatric:        Mood and Affect: Mood and affect normal.     ED Results / Procedures / Treatments   Labs (all labs ordered are listed, but only abnormal results are displayed) Labs Reviewed  RESP PANEL BY RT-PCR (FLU A&B, COVID) [id number]    EKG None  Radiology No results found.  Procedures Procedures (including critical care time)  Medications Ordered in ED Medications - No data to display  ED Course  I have reviewed the triage vital signs and the nursing notes.  Pertinent labs & imaging results that were available during my care of the patient were reviewed by me and considered in my medical decision making (see chart for details).  This patient complains of  sinus congestion and pressure and viral URI type syndrome.  This involves an extensive number of treatment options, and is a complaint that carries with it a high risk of complications and morbidity.  The differential diagnosis includes  Acute sinusitis including bacterial vs viral uri vs covid vs flu vs other  Doubt sepsis or bacterial PNA clinically Doubt meningitis   Given the duration of his symptoms and maxillary ttp, we can start on augmentin 7 days for sinusitis and recommend flonase for decongestion, and try some tessalon as well  We'll perform covid/flu swab as well  Okay for discharge  Gabriel Potter was evaluated in Emergency Department on 05/19/2020 for the symptoms  described in the history of present illness. He was evaluated in the context of the global COVID-19 pandemic, which necessitated consideration that the patient might be at risk for infection with the SARS-CoV-2 virus that causes COVID-19. Institutional protocols and algorithms that pertain to the evaluation of patients at risk for COVID-19 are in a state of rapid change based on information released by regulatory bodies including the CDC and federal and state organizations. These policies and algorithms were followed during the patient's care in the ED.    Final Clinical Impression(s) / ED Diagnoses Final diagnoses:  Maxillary sinusitis, unspecified chronicity  Cough  Postnasal drip    Rx / DC Orders ED Discharge Orders         Ordered    amoxicillin-clavulanate (AUGMENTIN) 875-125 MG tablet  Every 12 hours        05/19/20 1430    benzonatate (TESSALON) 100 MG capsule  2 times daily        05/19/20 1430    fluticasone (FLONASE) 50 MCG/ACT nasal spray  Daily       Note to Pharmacy: Size 1 bottle   05/19/20 1430           Terald Sleeper, MD 05/19/20 1437

## 2020-05-19 NOTE — Discharge Instructions (Signed)
Your Covid and flu test should result by early this evening.  You can check online for your results.  I prescribed 7 days of an antibiotic called Augmentin to help with possible sinusitis.  You can begin taking this today.  Try to take it with food.  Many people experience some stomach upset while taking this medication, so remember to take this with some food.  I would also recommend that you use Flonase for the next 3 days, twice a day (including at bedtime) to try to clear your sinuses.  2 sprays into each nostril.  I prescribed tessalon pearls which may help some with coughing, but a spoon of honey at night and sleeping upright on a few pillows is also helpful.

## 2021-07-03 IMAGING — DX DG ANKLE COMPLETE 3+V*L*
3 series · 3 of 3 positions shown · non-contrast
Comparison: None.

CLINICAL DATA: Left ankle pain since a fall and twisting injury
today. Initial encounter.

EXAM:
LEFT ANKLE COMPLETE - 3+ VIEW

[ankle ap]
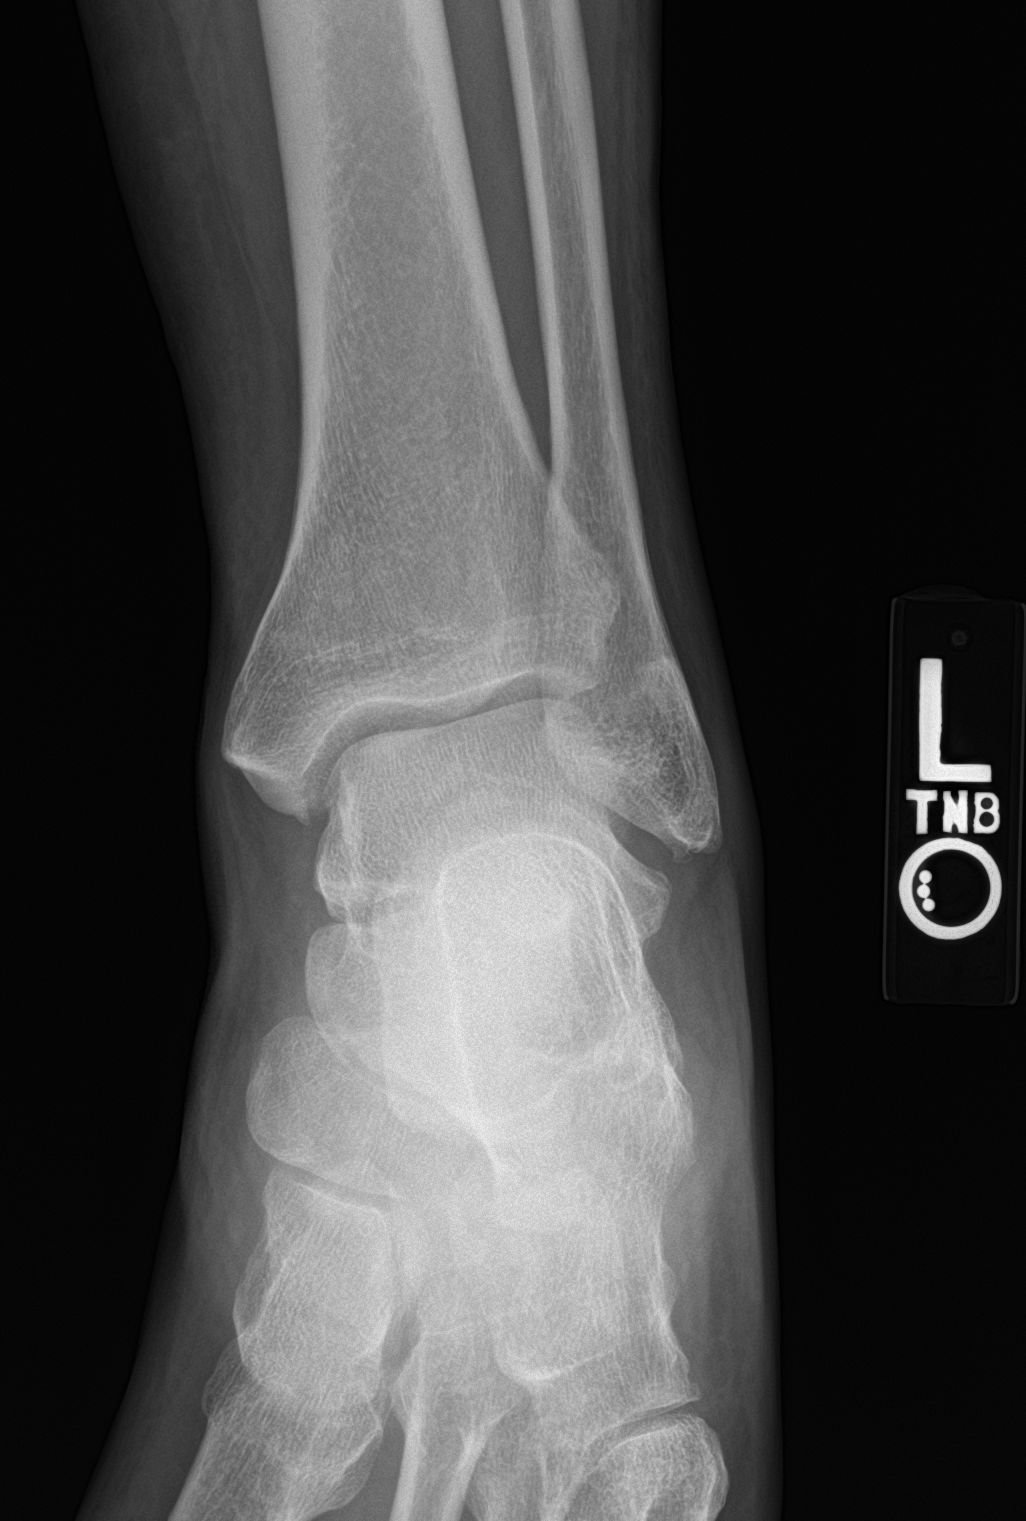

[ankle obl]
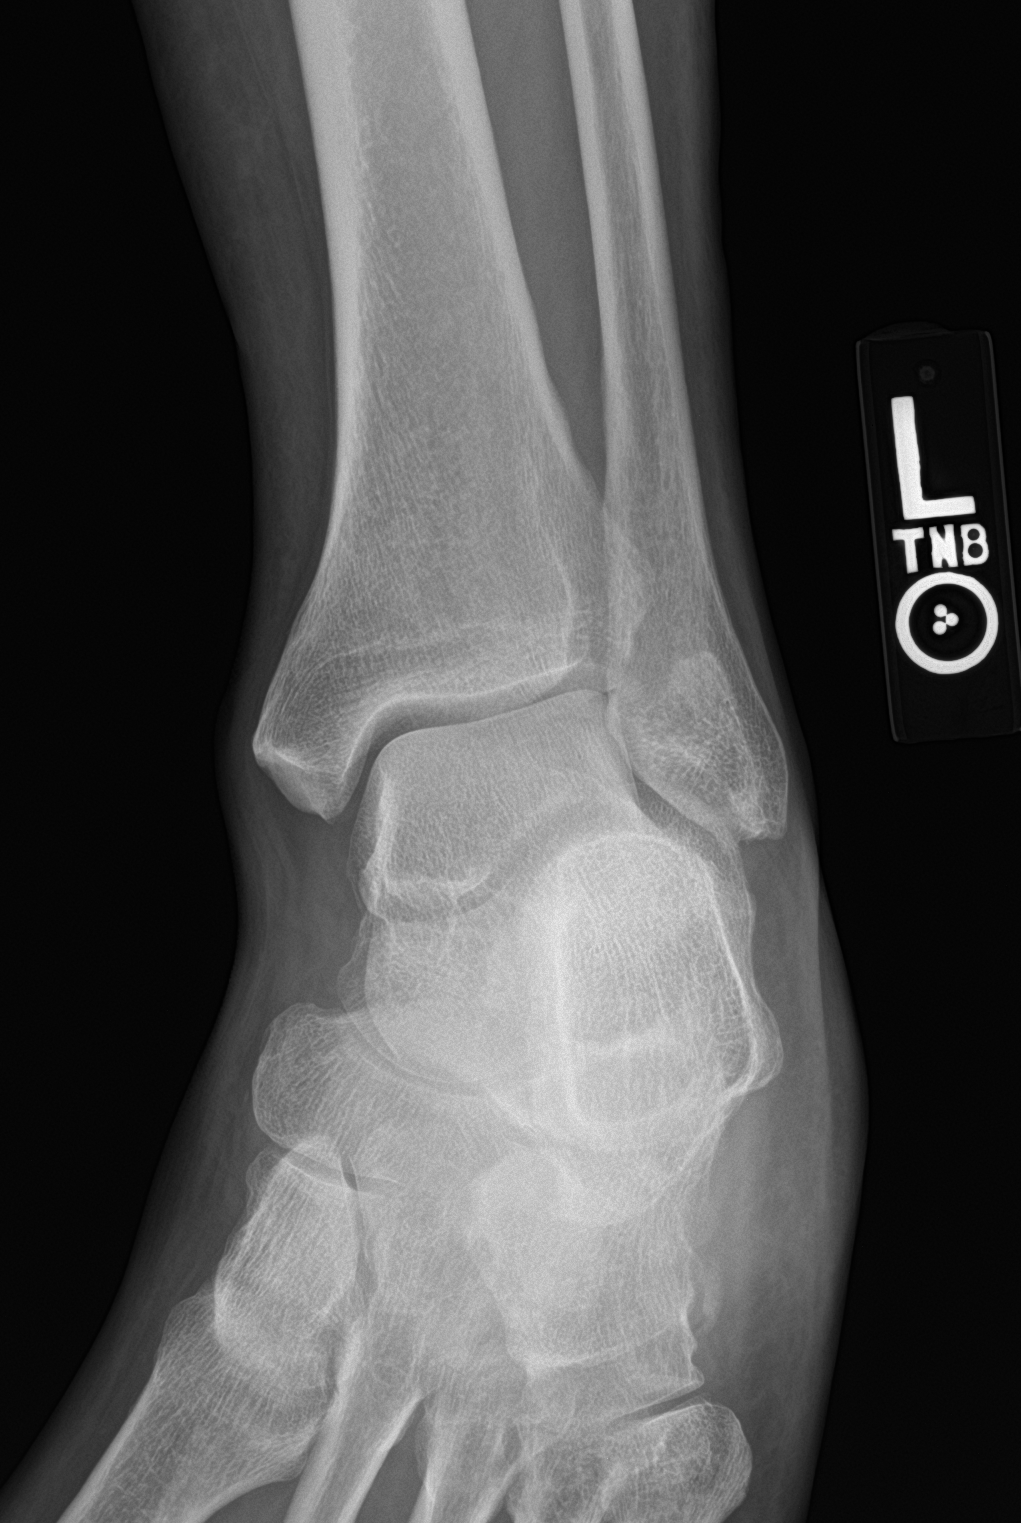

[ankle lat]
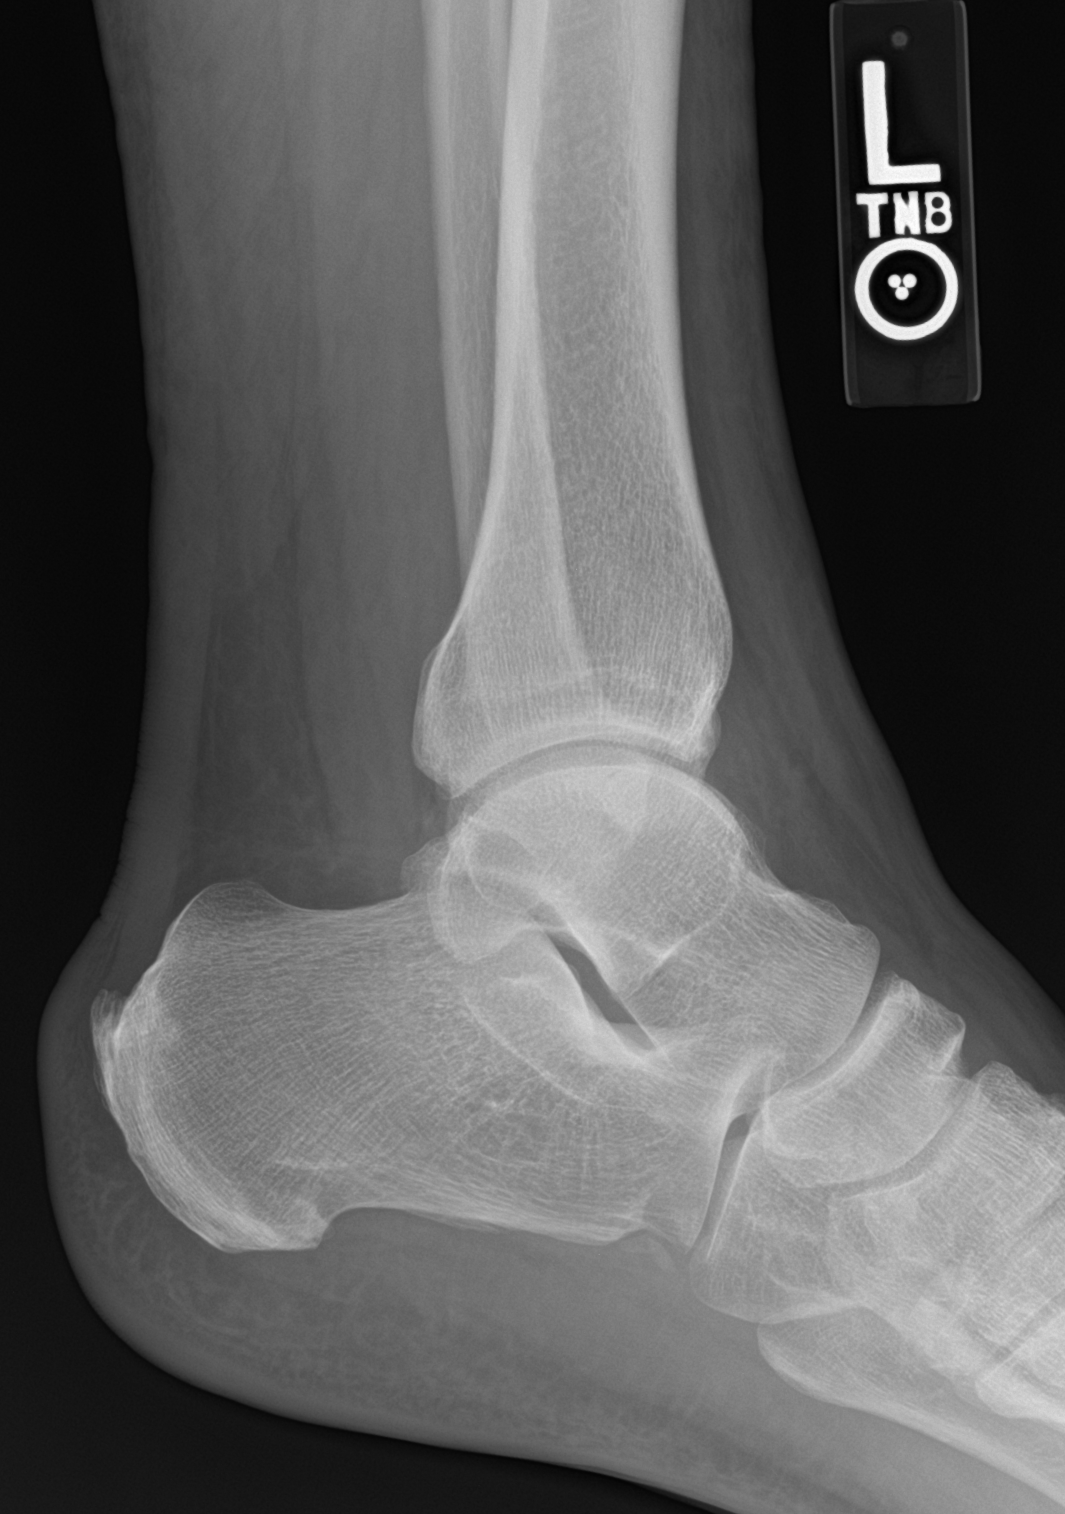

[3 of 3 positions shown; findings below may reference images not displayed]

FINDINGS: There is no evidence of fracture, dislocation, or joint effusion.
There is no evidence of arthropathy or other focal bone abnormality.
Soft tissues are unremarkable.
IMPRESSION: Negative exam.

## 2021-07-18 ENCOUNTER — Telehealth: Payer: Self-pay | Admitting: *Deleted

## 2021-07-18 NOTE — Telephone Encounter (Signed)
Pt wife is wanting to schedule pt a new patient appt with Dr. Posey Rea.   Informed pt that we would schedule appt once receiving okay from Dr. Macario Golds.

## 2021-07-19 NOTE — Telephone Encounter (Signed)
Okay.  Thanks.

## 2023-01-30 ENCOUNTER — Telehealth: Payer: Self-pay | Admitting: Internal Medicine

## 2023-01-30 NOTE — Telephone Encounter (Signed)
Good Morning Dr. Posey Rea, Would you please let me know if you have agreed to see Mr. Gabriel Potter as a New Patient?  He is Gabriel Potter' spouse.  If Ok'd by you I will contact the pt with an appointment date & time.  Thanks in advance!

## 2023-01-31 NOTE — Telephone Encounter (Signed)
Okay.  Thanks.

## 2023-02-13 ENCOUNTER — Ambulatory Visit: Payer: Self-pay | Admitting: Internal Medicine

## 2023-04-07 ENCOUNTER — Emergency Department (HOSPITAL_COMMUNITY)
Admission: EM | Admit: 2023-04-07 | Discharge: 2023-04-07 | Disposition: A | Discharge status: Home / Self Care | Payer: 59 | Attending: Family Medicine | Admitting: Family Medicine

## 2023-04-07 ENCOUNTER — Other Ambulatory Visit: Payer: Self-pay

## 2023-04-07 ENCOUNTER — Encounter (HOSPITAL_COMMUNITY): Payer: Self-pay

## 2023-04-07 ENCOUNTER — Emergency Department (HOSPITAL_COMMUNITY): Payer: 59

## 2023-04-07 DIAGNOSIS — I10 Essential (primary) hypertension: Secondary | ICD-10-CM | POA: Diagnosis not present

## 2023-04-07 DIAGNOSIS — Z7982 Long term (current) use of aspirin: Secondary | ICD-10-CM | POA: Diagnosis not present

## 2023-04-07 DIAGNOSIS — R079 Chest pain, unspecified: Principal | ICD-10-CM | POA: Insufficient documentation

## 2023-04-07 DIAGNOSIS — F17200 Nicotine dependence, unspecified, uncomplicated: Secondary | ICD-10-CM | POA: Diagnosis not present

## 2023-04-07 DIAGNOSIS — Z79899 Other long term (current) drug therapy: Secondary | ICD-10-CM | POA: Diagnosis not present

## 2023-04-07 LAB — CBC WITH DIFFERENTIAL/PLATELET
Abs Immature Granulocytes: 0.03 10*3/uL (ref 0.00–0.07)
Basophils Absolute: 0.1 10*3/uL (ref 0.0–0.1)
Basophils Relative: 1 %
Eosinophils Absolute: 0 10*3/uL (ref 0.0–0.5)
Eosinophils Relative: 1 %
HCT: 45.7 % (ref 39.0–52.0)
Hemoglobin: 15.3 g/dL (ref 13.0–17.0)
Immature Granulocytes: 1 %
Lymphocytes Relative: 23 %
Lymphs Abs: 1.5 10*3/uL (ref 0.7–4.0)
MCH: 29.8 pg (ref 26.0–34.0)
MCHC: 33.5 g/dL (ref 30.0–36.0)
MCV: 88.9 fL (ref 80.0–100.0)
Monocytes Absolute: 0.4 10*3/uL (ref 0.1–1.0)
Monocytes Relative: 6 %
Neutro Abs: 4.6 10*3/uL (ref 1.7–7.7)
Neutrophils Relative %: 68 %
Platelets: 218 10*3/uL (ref 150–400)
RBC: 5.14 MIL/uL (ref 4.22–5.81)
RDW: 12.8 % (ref 11.5–15.5)
WBC: 6.6 10*3/uL (ref 4.0–10.5)
nRBC: 0 % (ref 0.0–0.2)

## 2023-04-07 LAB — BASIC METABOLIC PANEL
Anion gap: 12 (ref 5–15)
BUN: 14 mg/dL (ref 6–20)
CO2: 23 mmol/L (ref 22–32)
Calcium: 9.4 mg/dL (ref 8.9–10.3)
Chloride: 105 mmol/L (ref 98–111)
Creatinine, Ser: 1.02 mg/dL (ref 0.61–1.24)
GFR, Estimated: >60 mL/min (ref >60)
Glucose, Bld: 97 mg/dL (ref 70–99)
Potassium: 4.2 mmol/L (ref 3.5–5.1)
Sodium: 140 mmol/L (ref 135–145)

## 2023-04-07 LAB — TROPONIN I (HIGH SENSITIVITY)
Troponin I (High Sensitivity): 4 ng/L (ref <18)
Troponin I (High Sensitivity): 6 ng/L (ref <18)

## 2023-04-07 MED ORDER — ASPIRIN 81 MG PO CHEW
324.0000 mg | CHEWABLE_TABLET | Freq: Once | ORAL | Status: AC
Start: 2023-04-07 — End: 2023-04-07
  Administered 2023-04-07: 324 mg via ORAL
  Filled 2023-04-07: qty 4

## 2023-04-07 MED ORDER — AMLODIPINE BESYLATE 5 MG PO TABS: 5.0000 mg | ORAL_TABLET | Freq: Every day | ORAL | 0 refills | Status: AC

## 2023-04-07 MED ORDER — ENOXAPARIN SODIUM 40 MG/0.4ML IJ SOSY: 40.0000 mg | PREFILLED_SYRINGE | INTRAMUSCULAR | Status: DC

## 2023-04-07 MED ORDER — AMLODIPINE BESYLATE 5 MG PO TABS
5.0000 mg | ORAL_TABLET | Freq: Once | ORAL | Status: AC
  Administered 2023-04-07: 5 mg via ORAL
  Filled 2023-04-07: qty 1

## 2023-04-07 MED ORDER — ASPIRIN 81 MG PO CHEW: 81.0000 mg | CHEWABLE_TABLET | Freq: Every day | ORAL | Status: DC

## 2023-04-07 MED ORDER — ACETAMINOPHEN 500 MG PO TABS: 1000.0000 mg | ORAL_TABLET | Freq: Four times a day (QID) | ORAL | Status: DC | PRN

## 2023-04-07 MED ORDER — NITROGLYCERIN 0.4 MG SL SUBL
0.4000 mg | SUBLINGUAL_TABLET | SUBLINGUAL | Status: DC | PRN
  Administered 2023-04-07: 0.4 mg via SUBLINGUAL
  Filled 2023-04-07: qty 1

## 2023-04-07 NOTE — ED Notes (Signed)
Pt d/c home per MD order. Discharge summary reviewed, pt verbalizes understanding. Ambulatory off unit. No s/s of acute distress noted.  

## 2023-04-07 NOTE — ED Triage Notes (Addendum)
Reports palpitation last night. Woke up this morning sharp central chest pain; has been intermittent ever since. Reports pain and "fluttering" with radiation down LUE. Reports nausea and SOB.

## 2023-04-07 NOTE — Discharge Instructions (Signed)

## 2023-04-07 NOTE — Progress Notes (Addendum)
BRIEF AMA SUMMARY  Paged for admission on this patient and presented to bedside to evaluate him with Dr. Phineas Real and Student Dr. Threasa Beards.  Patient is surprised that he is being admitted, and immediately states he is not sure this is what he wants.  He requests more information on why he is being considered for admission.  I discussed with him his workup thus far and answered all questions to the best of my ability. We noted that his vitals and exam were grossly normal. We discussed that his EKG was without evidence of heart attack, that his initial enzymes were not suspicious for heart attack though we would recheck this, and that his chest x-ray also showed no acute disease process.  We also discussed that because of his age and his multiple risk factors (including hypertension, obesity, prior MI), we would consider him in the moderate risk group (HEART score 5) and would recommend admitting him for observation.   The patient reports that he spent the weekend moving heavy furniture and doing lots of physical labor to help out his family members and prepare for his son's upcoming wedding in 10 days.  He attributes his chest pain to this.  He states that this morning he suddenly felt a sharp shooting pain across his chest that lasted for 10 to 15 seconds.  After that the sharp pain alleviated, he has just felt "off" since then.  He also shares that his wife is recovering from radiation therapy for breast cancer and it is very important to him to be home with her.  He is concerned that if he is admitted, he will not be able to help her with her daily needs.  We had a thorough risk-benefit discussion.  Patient was able to explain the possible risks to his wellbeing should he decline admission and return home, including and up to that he may "fall over dead."  He is adamant that he will return promptly to the emergency department if he worsens or has any new concerning symptoms, as he lives 8 minutes away.  He  understands he will be leaving AGAINST MEDICAL ADVICE and will sign necessary forms.  Strongly encouraged the patient to seek out a PCP to follow-up on his chest pain as well as his high blood pressure, as he likely has stable angina.  Discussed with ED physician providing him with resources for PCPs accepting new patients.  Cyndia Skeeters, DO  Family Medicine, PGY-1 04/07/23 2:16 PM   I agree with the assessment and plan as documented above.  Janeal Holmes, MD PGY-2, [facility]

## 2023-04-07 NOTE — Assessment & Plan Note (Deleted)
Based on presentation and heart score of 5 (moderate score) will admit patient for observation. - Admit to FMTS, med telemetry, attending Dr. Miquel Dunn - ***, Vital signs per floor - *** diet  - PT/OT to treat - VTE prophylaxis *** - Fluids *** - Continue antibiotics *** - AM CBC/BMP *** - Fall precautions*** - Delirium precautions***

## 2023-04-07 NOTE — ED Provider Notes (Signed)
[facility] AT [facility] Provider Note   CSN: [id number] Arrival date & time: 04/07/23  1013     History  Chief Complaint  Patient presents with   Chest Pain    Gabriel Potter is a 57 y.o. male.  HPI 57 year old male with history of prior MI presents with chest pain.  Symptoms started around 730-8 AM.  He was already awake and then developed sudden onset heaviness/pressure to his left lateral and anterior chest.  He also had pain in his arm and was short of breath.  He was very nauseated.  No diaphoresis or new back pain.  No leg swelling.  Overall the symptoms lasted less than a minute but then ever since then has had some dull discomfort to his left lateral chest that comes in waves.  Never fully went away.  He is not on any medications at baseline including no blood pressure meds.  This feels similar to when he had his prior MI.  Right now the discomfort is about a 3-4 but previously was a 10.  Home Medications Prior to Admission medications   Medication Sig Start Date End Date Taking? Authorizing Provider  Acetaminophen (TYLENOL PO) Take 2-3 tablets by mouth 2 (two) times daily as needed (pain, fever, headache).   Yes [provider]  amLODipine (NORVASC) 5 MG tablet Take 1 tablet (5 mg total) by mouth daily. 04/07/23 05/07/23 Yes Pricilla Loveless, MD  aspirin 81 MG tablet Take 81 mg by mouth daily.   Yes [provider]      Allergies    Patient has no known allergies.    Review of Systems   Review of Systems  Constitutional:  Negative for diaphoresis.  Respiratory:  Positive for shortness of breath.   Cardiovascular:  Positive for chest pain.  Gastrointestinal:  Positive for nausea. Negative for abdominal pain.  Musculoskeletal:  Negative for back pain.    Physical Exam Updated Vital Signs BP (!) 168/89   Pulse 66   Temp 98.4 F (36.9 C) (Oral)   Resp 12   Ht 5\' 9"  (1.753 m)   Wt 97.5 kg   SpO2 100%   BMI 31.75  kg/m  Physical Exam Vitals and nursing note reviewed.  Constitutional:      Appearance: He is well-developed.  HENT:     Head: Normocephalic and atraumatic.  Cardiovascular:     Rate and Rhythm: Normal rate and regular rhythm.     Heart sounds: Normal heart sounds.  Pulmonary:     Effort: Pulmonary effort is normal.     Breath sounds: Normal breath sounds.  Chest:     Chest wall: No tenderness.  Abdominal:     Palpations: Abdomen is soft.     Tenderness: There is no abdominal tenderness.  Musculoskeletal:     Right lower leg: No edema.     Left lower leg: No edema.  Skin:    General: Skin is warm and dry.  Neurological:     Mental Status: He is alert.    ED Results / Procedures / Treatments   Labs (all labs ordered are listed, but only abnormal results are displayed) Labs Reviewed  BASIC METABOLIC PANEL  CBC WITH DIFFERENTIAL/PLATELET  HIV ANTIBODY (ROUTINE TESTING W REFLEX)  TROPONIN I (HIGH SENSITIVITY)  TROPONIN I (HIGH SENSITIVITY)    EKG EKG Interpretation Date/Time:  Monday April 07 2023 10:22:48 EDT Ventricular Rate:  72 PR Interval:  139 QRS Duration:  87 QT Interval:  407 QTC Calculation: 446 R Axis:   42  Text Interpretation: Sinus rhythm no acute ST/T changes similar to 2019 Confirmed by Pricilla Loveless [id number]) on 04/07/2023 10:26:40 AM  Radiology DG Chest 2 View  Result Date: 04/07/2023 CLINICAL DATA:  Chest pain. EXAM: CHEST - 2 VIEW COMPARISON:  07/24/2017. FINDINGS: Low lung volume. There are probable atelectatic changes/scarring in the left retrocardiac region. Bilateral lung fields are otherwise clear. Elevated right hemidiaphragm again seen. Bilateral costophrenic angles are clear. Mildly enlarged cardio-mediastinal silhouette, which may be accentuated by low lung volume. No acute osseous abnormalities. The soft tissues are within normal limits. IMPRESSION: *No acute cardiopulmonary disease. Electronically Signed   By: Jules Schick M.D.    On: 04/07/2023 13:07    Procedures Procedures    Medications Ordered in ED Medications  nitroGLYCERIN (NITROSTAT) SL tablet 0.4 mg (0.4 mg Sublingual Given 04/07/23 1240)  aspirin chewable tablet 81 mg (81 mg Oral Not Given 04/07/23 1453)  acetaminophen (TYLENOL) tablet 1,000 mg (has no administration in time range)  enoxaparin (LOVENOX) injection 40 mg (has no administration in time range)  aspirin chewable tablet 324 mg (324 mg Oral Given 04/07/23 1239)  amLODipine (NORVASC) tablet 5 mg (5 mg Oral Given 04/07/23 1512)    ED Course/ Medical Decision Making/ A&P             HEART Score: 5                    Medical Decision Making Amount and/or Complexity of Data Reviewed Labs: ordered.    Details: Normal troponins x 2 Radiology: ordered and independent interpretation performed.    Details: No chf ECG/medicine tests: ordered and independent interpretation performed.    Details: No acute ischemia  Risk OTC drugs. Prescription drug management.   Patient presents with chest pain.  Given his history of hypertension, smoking, and possible ACS in the past, his heart score is a 5.  His chest pain resolved with aspirin and nitro.  Given this, I discussed observation versus discharge with close cards follow-up.  He does not have a PCP though he is working on that.  He agreed initially to stay and so I consulted family practice but then upon talking to them he now wants to leave.  He understands potential missed diagnoses and risks and possibly even death with leaving.  He second troponin is negative and so I further discussed this with him and he still would like to leave.  His blood pressure has been consistently elevated here over 160/90 and so I did offer to place him on blood pressure meds as I suspect he is had hypertension for a while.  He is okay with this and we will refer him to cardiology as an outpatient.  He states he is already setting up a PCP visit, will give him a month  supply of blood pressure meds.  Will give return precautions.  Discussed that while he does not have a heart attack today, this does not rule out heart disease and he needs to return if symptoms recur or worsen.        Final Clinical Impression(s) / ED Diagnoses Final diagnoses:  Chest pain, unspecified type  Uncontrolled hypertension    Rx / DC Orders ED Discharge Orders          Ordered    amLODipine (NORVASC) 5 MG tablet  Daily        04/07/23 1455  Pricilla Loveless, MD 04/07/23 [phone]

## 2023-04-07 NOTE — Hospital Course (Addendum)
Chest pain, higher risk. Heart score 5. HTN, smokes. Pain responsive to nitroglycerin. Self reported history of MI. Cards consulted. No follow up to go home with. Admit for obs and echo.
